# Patient Record
Sex: Male | Born: 1995 | Race: White | Hispanic: No | Marital: Married | Smoking: Current every day smoker
Health system: Southern US, Community
[De-identification: ages and names within clinical notes are randomized; demographics above are authoritative.]

## PROBLEM LIST (undated history)

## (undated) DIAGNOSIS — F4310 Post-traumatic stress disorder, unspecified: Secondary | ICD-10-CM

## (undated) DIAGNOSIS — F319 Bipolar disorder, unspecified: Secondary | ICD-10-CM

## (undated) HISTORY — PX: NO PAST SURGERIES: SHX2092

## (undated) HISTORY — PX: OTHER SURGICAL HISTORY: SHX169

## (undated) HISTORY — PX: UPPER GASTROINTESTINAL ENDOSCOPY: SHX188

## (undated) HISTORY — DX: Post-traumatic stress disorder, unspecified: F43.10

## (undated) HISTORY — PX: COLONOSCOPY: SHX174

## (undated) NOTE — ED Triage Notes (Signed)
Formatting of this note might be different from the original.  Pt brought in by [facility] from home with c/o mid left sided abdominal pain and n/v that started this morning at 0700. Pt reports he has chronic abdominal pain x 2-2.5 years now, with no diagnosis because he hasn't been able to go to the doctor. EMS reported pt was extremely diaphoretic, pale, curled up in a ball sitting in a chair in the bathroom when they arrived. Pt was given a total of of Fentanyl and 8mg  of Zofran by EMS -     1605-Fentanyl IV by EMS  1610-Fentanyl IV by EMS  1615-Zofran 4mg  IV by EMS  1618-Fentanyl IV by EMS  1630-Zofran 4mg  IV by EMS  1655-Fentanyl IV by EMS    Pt reports the pain is now down to a 3 of 4. Describes the pain as a tightness. Denies nausea currently. Pt was placed on O2 via NC by EMS during medication administration due to O2 sat dropping when he fell asleep. Pt now alert and oriented with O2 sat 95% on RA.   Electronically signed by Kem Boroughs, RN at 01/10/2022  5:43 PM EST

## (undated) NOTE — ED Provider Notes (Signed)
Formatting of this note is different from the original.  Images from the original note were not included.    [facility] EMERGENCY DEPARTMENT  Provider Note    CSN: [id number]  Arrival date & time: 01/10/22  1727        History    Chief Complaint   Patient presents with    Abdominal Pain     Francisco Carlson is a 21 y.o. male.  With a history of chronic abdominal pain, cyclical vomiting, persistent marijuana use, depression, bipolar, PTSD, irritable bowel syndrome, GERD who presents to the ED for evaluation of left-sided abdominal pain, nausea and vomiting.  States the symptoms have been persistent for the past 2 to 2-1/2 years.  Has seen multiple providers and had numerous work-ups done without identifying a cause for his pain.  He states he typically smokes marijuana which makes the pain go away, however it always returns.  Did smoke marijuana yesterday.  States he has abdominal pain at baseline, however it got significantly worse this morning.  Denies fevers, chills, chest pain, shortness of breath, dysuria, frequency, urgency, melena, hematochezia, hematemesis, history of abdominal surgery.  Rated his pain at a 3 out of 10 on initial evaluation.    Abdominal Pain  Associated symptoms: nausea and vomiting          Home Medications  Prior to Admission medications    Medication Sig Start Date End Date Taking? Authorizing Provider   dicyclomine (BENTYL) 20 MG tablet Take 1 tablet (20 mg total) by mouth in the morning, at noon, in the evening, and at bedtime. 07/29/21   Gilmore Laroche, FNP   loperamide (IMODIUM A-D) 2 MG tablet Start: 4 mg PO x1, then 2 mg PO after each loose stool until maint.dose established; Max:16 mg/day 07/29/21   Gilmore Laroche, FNP   omeprazole (PRILOSEC) 40 MG capsule Take 40 mg by mouth daily.    [provider]   promethazine (PHENERGAN) 25 MG tablet Take 25 mg by mouth every 6 (six) hours as needed for nausea or vomiting.    [provider]       Allergies     Patient has no  known allergies.      Review of Systems    Review of Systems   Gastrointestinal:  Positive for abdominal pain, nausea and vomiting.   All other systems reviewed and are negative.    Physical Exam  Updated Vital Signs  BP 123/64   Pulse 89   Temp 97.8 F (36.6 C) (Oral)   Resp 17   Ht 5\' 4"  (1.626 m)   Wt 72.6 kg   SpO2 98%   BMI 27.46 kg/m   Physical Exam  Vitals and nursing note reviewed.   Constitutional:       General: He is not in acute distress.     Appearance: He is well-developed. He is not ill-appearing, toxic-appearing or diaphoretic.   HENT:      Head: Normocephalic and atraumatic.   Eyes:      Conjunctiva/sclera: Conjunctivae normal.   Cardiovascular:      Rate and Rhythm: Normal rate and regular rhythm.      Heart sounds: No murmur heard.  Pulmonary:      Effort: Pulmonary effort is normal. No respiratory distress.      Breath sounds: Normal breath sounds. No stridor. No wheezing, rhonchi or rales.   Abdominal:      General: Abdomen is flat. There is no distension.  Palpations: Abdomen is soft.      Tenderness: There is abdominal tenderness in the suprapubic area, left upper quadrant and left lower quadrant. There is no guarding or rebound. Negative signs include Murphy's sign, Rovsing's sign, McBurney's sign, psoas sign and obturator sign.   Musculoskeletal:         General: No swelling.      Cervical back: Neck supple.   Skin:     General: Skin is warm and dry.      Capillary Refill: Capillary refill takes less than 2 seconds.   Neurological:      General: No focal deficit present.      Mental Status: He is alert and oriented to person, place, and time.   Psychiatric:         Mood and Affect: Mood normal.         Behavior: Behavior normal.     ED Results / Procedures / Treatments    Labs  (all labs ordered are listed, but only abnormal results are displayed)  Labs Reviewed   COMPREHENSIVE METABOLIC PANEL - Abnormal; Notable for the following components:       Result Value    Glucose, Bld  105 (*)     Total Bilirubin 1.8 (*)     Anion gap 4 (*)     All other components within normal limits   CBC - Abnormal; Notable for the following components:    WBC 16.5 (*)     All other components within normal limits   URINALYSIS, ROUTINE W REFLEX MICROSCOPIC - Abnormal; Notable for the following components:    Color, Urine AMBER (*)     APPearance HAZY (*)     Ketones, ur 5 (*)     Protein, ur 100 (*)     All other components within normal limits   LIPASE, BLOOD     EKG  None    Radiology  No results found.    Procedures  Procedures     Medications Ordered in ED  Medications   haloperidol lactate (HALDOL) injection 1 mg (1 mg Intravenous Given 01/10/22 1954)     ED Course/ Medical Decision Making/ A&P      Medical Decision Making  Amount and/or Complexity of Data Reviewed  Labs: ordered.    Risk  Prescription drug management.    This patient presents to the ED for concern of abdominal pain, this involves an extensive number of treatment options, and is a complaint that carries with it a high risk of complications and morbidity. The differential diagnosis for generalized abdominal pain includes, but is not limited to AAA, gastroenteritis, appendicitis, Bowel obstruction, Bowel perforation. Gastroparesis, DKA, Hernia, Inflammatory bowel disease, mesenteric ischemia, pancreatitis, peritonitis SBP, volvulus.     Co morbidities that complicate the patient evaluation    chronic abdominal pain, cyclical vomiting, persistent marijuana use, depression, bipolar, PTSD, irritable bowel syndrome, GERD    My initial workup includes abdominal pain labs, Haldol    Additional history obtained from:  Nursing notes from this visit.  Previous records within EMR system office visit on 08/31/2021 with gastroenterology describing to patient that he likely has cannabis hyperemesis along with IBS and that he should stop smoking marijuana.  These were final recommendations after significant work-up was performed.  EMS provides a portion  of the history.  EMS gave a total of 300 mcg of fentanyl and 8 mg of Zofran prior to arrival    I ordered, reviewed and interpreted labs which  include: CMP, CBC, lipase, urinalysis.  CBC significant for leukocytosis of 16.5.  This is likely reactive to the pain.    Cardiac Monitoring:    The patient was maintained on a cardiac monitor.  I personally viewed and interpreted the cardiac monitored which showed an underlying rhythm of: NSR    Afebrile, hemodynamically stable.  22 year old male presenting to the ED for evaluation of abdominal pain.  Pain is similar in characteristic to the pain that he has had for the past 2 to 2-1/2 years.  Pain typically presents shortly after he smokes marijuana.  Patient did smoke marijuana yesterday.  Has had an extensive GI work-up which has so far been unremarkable.  In the absence of fever or change in his symptoms, I do not see need for imaging at this time.  Patient was given haloperidol for possible cyclical vomiting versus cannabis hyperemesis.  After administration of this, patient stated that his symptoms have nearly resolved, however patient became visibly upset that we are not performing CT scan today and does not believe his pain to be secondary to his marijuana use and wanted to be discharged prior to his labs being resulted.  I did initially agree to this, however did not have the chance to discuss return precautions with him.  He did elope prior to being given his discharge paperwork or discharge education.  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    At this time there does not appear to be any evidence of an acute emergency medical condition and the patient appears stable for discharge with appropriate outpatient follow up.All questions answered.    Patient's case discussed with Dr. Renaye Rakers who agrees with plan to discharge with follow-up.     Note: Portions of  this report may have been transcribed using voice recognition software. Every effort was made to ensure accuracy; however, inadvertent computerized transcription errors may still be present.     Final Clinical Impression(s) / ED Diagnoses  Final diagnoses:   None     Rx / DC Orders  ED Discharge Orders       None           Mora Bellman  01/10/22 2106      Terald Sleeper, MD  01/10/22 2317    Electronically signed by Terald Sleeper, MD at 01/10/2022 11:17 PM EST    Associated attestation - Terald Sleeper, MD - 01/10/2022 11:17 PM EST  Formatting of this note might be different from the original.  Attestation: Medical screening examination/treatment/procedure(s) were performed by non-physician practitioner and as supervising physician I was immediately available for consultation/collaboration.

## (undated) NOTE — ED Notes (Signed)
Formatting of this note might be different from the original.  Pt calling out stating he was ready to leave. Pt agitated because he feels like we "never do anything for him". States we "just dope him up and send him home telling him it's because of the pot he smokes". Derrek Gu, PA at bedside and stated he would get the pt ready for discharge.   Electronically signed by Wonda Horner, RN at 01/10/2022  8:19 PM EST

## (undated) NOTE — ED Notes (Signed)
Formatting of this note might be different from the original.  Pt left without waiting for his discharge paperwork.  Electronically signed by Wonda Horner, RN at 01/10/2022  8:22 PM EST

---
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
## 2015-09-07 ENCOUNTER — Encounter (HOSPITAL_COMMUNITY): Payer: Self-pay | Admitting: Emergency Medicine

## 2015-09-07 ENCOUNTER — Emergency Department (HOSPITAL_COMMUNITY)
Admission: EM | Admit: 2015-09-07 | Discharge: 2015-09-07 | Disposition: A | Discharge status: Home / Self Care | Payer: No Typology Code available for payment source | Attending: Emergency Medicine | Admitting: Emergency Medicine

## 2015-09-07 DIAGNOSIS — F17210 Nicotine dependence, cigarettes, uncomplicated: Secondary | ICD-10-CM | POA: Insufficient documentation

## 2015-09-07 DIAGNOSIS — H65192 Other acute nonsuppurative otitis media, left ear: Secondary | ICD-10-CM | POA: Insufficient documentation

## 2015-09-07 DIAGNOSIS — F319 Bipolar disorder, unspecified: Secondary | ICD-10-CM | POA: Insufficient documentation

## 2015-09-07 MED ORDER — AMOXICILLIN-POT CLAVULANATE 875-125 MG PO TABS: 1.0000 | ORAL_TABLET | Freq: Two times a day (BID) | ORAL | Status: DC

## 2015-09-07 MED ORDER — HYDROCODONE-ACETAMINOPHEN 5-325 MG PO TABS: ORAL_TABLET | ORAL | Status: DC

## 2015-09-07 MED ORDER — IBUPROFEN 800 MG PO TABS: 800.0000 mg | ORAL_TABLET | Freq: Three times a day (TID) | ORAL | Status: DC

## 2015-09-07 NOTE — ED Notes (Signed)
Patient states "about 4-6 days ago I couldn't hear out of my left ear and now it's both ears. They only hurt at night."

## 2015-09-07 NOTE — ED Provider Notes (Signed)
CSN: [id number]     Arrival date & time 09/07/15  1543 History   First MD Initiated Contact with Patient 09/07/15 1655     Chief Complaint  Patient presents with  . Otalgia     (Consider location/radiation/quality/duration/timing/severity/associated sxs/prior Treatment) HPI   Jacob Bradley is a 20 y.o. male who presents to the Emergency Department complaining of left ear pain for 5 days.  He describes a throbbing pain associated with decreased hearing.  Pain is worse at night.  He has put peroxide in his ear and used Q-tips without relief.  He denies fever, cough, ear drainage , headache and dizziness.    Past Medical History  Diagnosis Date  . Bipolar disorder (HCC)    History reviewed. No pertinent past surgical history. History reviewed. No pertinent family history. Social History  Substance Use Topics  . Smoking status: Current Every Day Smoker    Types: Cigarettes  . Smokeless tobacco: None  . Alcohol Use: No    Review of Systems  Constitutional: Negative for fever, chills, activity change and appetite change.  HENT: Positive for ear pain and hearing loss. Negative for congestion, ear discharge, facial swelling, rhinorrhea, sinus pressure, sore throat and trouble swallowing.   Eyes: Negative for visual disturbance.  Respiratory: Negative for cough, shortness of breath, wheezing and stridor.   Gastrointestinal: Negative for nausea, vomiting and abdominal pain.  Musculoskeletal: Negative for neck pain and neck stiffness.  Skin: Negative.   Neurological: Negative for dizziness, weakness, numbness and headaches.  Hematological: Negative for adenopathy.  Psychiatric/Behavioral: Negative for confusion.  All other systems reviewed and are negative.     Allergies  Review of patient's allergies indicates no known allergies.  Home Medications   Prior to Admission medications   Medication Sig Start Date End Date Taking? Authorizing Provider  amoxicillin-clavulanate  (AUGMENTIN) 875-125 MG tablet Take 1 tablet by mouth 2 (two) times daily. For 10 days 09/07/15   Quinette Hentges, PA-C  divalproex (DEPAKOTE ER) 500 MG 24 hr tablet Take 1 tablet (500 mg total) by mouth daily. 09/23/11 09/22/12  Gayland CurryGayathri D Tadepalli, MD  HYDROcodone-acetaminophen (NORCO/VICODIN) 5-325 MG tablet Take one tab po q 4-6 hrs prn pain 09/07/15   Joseph Bias, PA-C  ibuprofen (ADVIL,MOTRIN) 800 MG tablet Take 1 tablet (800 mg total) by mouth 3 (three) times daily. 09/07/15   Jeancarlo Leffler, PA-C   BP 140/85 mmHg  Pulse 73  Temp(Src) 98.7 F (37.1 C) (Oral)  Resp 16  Ht 5\' 5"  (1.651 m)  Wt 95.255 kg  BMI 34.95 kg/m2  SpO2 96% Physical Exam  Constitutional: He is oriented to person, place, and time. He appears well-developed and well-nourished. No distress.  HENT:  Head: Normocephalic and atraumatic.  Right Ear: Tympanic membrane and ear canal normal.  Left Ear: Ear canal normal. No drainage or swelling. No mastoid tenderness. No hemotympanum.  Mouth/Throat: Uvula is midline, oropharynx is clear and moist and mucous membranes are normal. No uvula swelling. No oropharyngeal exudate.  Erythema of the left TM.  Mild middle ear effusion present.  No drainage or edema of the ear canal.   Neck: Normal range of motion. Neck supple.  Cardiovascular: Normal rate, regular rhythm, normal heart sounds and intact distal pulses.   No murmur heard. Pulmonary/Chest: Effort normal and breath sounds normal. No stridor. No respiratory distress.  Musculoskeletal: Normal range of motion.  Lymphadenopathy:    He has no cervical adenopathy.  Neurological: He is alert and oriented to person, place, and  time. Coordination normal.  Skin: Skin is warm and dry. No rash noted.  Nursing note and vitals reviewed.   ED Course  Procedures (including critical care time) Labs Review Labs Reviewed - No data to display  Imaging Review No results found. I have personally reviewed and evaluated these images and  lab results as part of my medical decision-making.   EKG Interpretation None      MDM   Final diagnoses:  Acute nonsuppurative otitis media of left ear    Pt with significant OM.  Otherwise well appearing.  Advised of possible TM rupture and need for ENT f/u.  Pt stable for d/c.  Rx for augmentin, ibuprofen and #10 vicodin    Pauline Aus, PA-C 09/07/15 1722  Loren Racer, MD 09/08/15 1606

## 2015-09-07 NOTE — Discharge Instructions (Signed)
Otitis Media, Adult °Otitis media is redness, soreness, and puffiness (swelling) in the space just behind your eardrum (middle ear). It may be caused by allergies or infection. It often happens along with a cold. °HOME CARE °· Take your medicine as told. Finish it even if you start to feel better. °· Only take over-the-counter or prescription medicines for pain, discomfort, or fever as told by your doctor. °· Follow up with your doctor as told. °GET HELP IF: °· You have otitis media only in one ear, or bleeding from your nose, or both. °· You notice a lump on your neck. °· You are not getting better in 3-5 days. °· You feel worse instead of better. °GET HELP RIGHT AWAY IF:  °· You have pain that is not helped with medicine. °· You have puffiness, redness, or pain around your ear. °· You get a stiff neck. °· You cannot move part of your face (paralysis). °· You notice that the bone behind your ear hurts when you touch it. °MAKE SURE YOU:  °· Understand these instructions. °· Will watch your condition. °· Will get help right away if you are not doing well or get worse. °  °This information is not intended to replace advice given to you by your health care provider. Make sure you discuss any questions you have with your health care provider. °  °Document Released: 07/27/2007 Document Revised: 02/28/2014 Document Reviewed: 09/04/2012 °Elsevier Interactive Patient Education ©2016 [employer] ° ° °

## 2015-11-21 ENCOUNTER — Emergency Department (HOSPITAL_COMMUNITY)
Admission: EM | Admit: 2015-11-21 | Discharge: 2015-11-21 | Disposition: A | Discharge status: Home / Self Care | Payer: Medicaid Other | Attending: Emergency Medicine | Admitting: Emergency Medicine

## 2015-11-21 ENCOUNTER — Encounter (HOSPITAL_COMMUNITY): Payer: Self-pay | Admitting: Emergency Medicine

## 2015-11-21 DIAGNOSIS — S60450A Superficial foreign body of right index finger, initial encounter: Secondary | ICD-10-CM | POA: Insufficient documentation

## 2015-11-21 DIAGNOSIS — Z23 Encounter for immunization: Secondary | ICD-10-CM | POA: Insufficient documentation

## 2015-11-21 DIAGNOSIS — Y929 Unspecified place or not applicable: Secondary | ICD-10-CM | POA: Insufficient documentation

## 2015-11-21 DIAGNOSIS — Y999 Unspecified external cause status: Secondary | ICD-10-CM | POA: Insufficient documentation

## 2015-11-21 DIAGNOSIS — W458XXA Other foreign body or object entering through skin, initial encounter: Secondary | ICD-10-CM | POA: Insufficient documentation

## 2015-11-21 DIAGNOSIS — Y939 Activity, unspecified: Secondary | ICD-10-CM | POA: Insufficient documentation

## 2015-11-21 DIAGNOSIS — F17210 Nicotine dependence, cigarettes, uncomplicated: Secondary | ICD-10-CM | POA: Insufficient documentation

## 2015-11-21 DIAGNOSIS — S6990XA Unspecified injury of unspecified wrist, hand and finger(s), initial encounter: Secondary | ICD-10-CM

## 2015-11-21 MED ORDER — POVIDONE-IODINE 10 % EX SOLN
CUTANEOUS | Status: AC
  Filled 2015-11-21: qty 118

## 2015-11-21 MED ORDER — LIDOCAINE HCL (PF) 1 % IJ SOLN
INTRAMUSCULAR | Status: AC
  Filled 2015-11-21: qty 5

## 2015-11-21 MED ORDER — IBUPROFEN 800 MG PO TABS: 800.0000 mg | ORAL_TABLET | Freq: Three times a day (TID) | ORAL | 0 refills | Status: DC

## 2015-11-21 MED ORDER — BACITRACIN ZINC 500 UNIT/GM EX OINT
TOPICAL_OINTMENT | CUTANEOUS | Status: AC
  Filled 2015-11-21: qty 0.9

## 2015-11-21 MED ORDER — TETANUS-DIPHTH-ACELL PERTUSSIS 5-2.5-18.5 LF-MCG/0.5 IM SUSP
0.5000 mL | Freq: Once | INTRAMUSCULAR | Status: AC
  Administered 2015-11-21: 0.5 mL via INTRAMUSCULAR
  Filled 2015-11-21: qty 0.5

## 2015-11-21 MED ORDER — BACITRACIN ZINC 500 UNIT/GM EX OINT: 1.0000 "application " | TOPICAL_OINTMENT | Freq: Two times a day (BID) | CUTANEOUS | 0 refills | Status: DC

## 2015-11-21 MED ORDER — AMOXICILLIN-POT CLAVULANATE 875-125 MG PO TABS: 1.0000 | ORAL_TABLET | Freq: Two times a day (BID) | ORAL | 0 refills | Status: DC

## 2015-11-21 MED ORDER — LIDOCAINE HCL (PF) 1 % IJ SOLN
30.0000 mL | Freq: Once | INTRAMUSCULAR | Status: DC
  Filled 2015-11-21: qty 30

## 2015-11-21 NOTE — ED Notes (Signed)
Patient right index finger cleaned and dressed.

## 2015-11-21 NOTE — ED Triage Notes (Signed)
Patient has small fishing hook with single barb in "pad" of right index finger. No active bleeding noted. Patient unsure of last Tetanus vaccination.

## 2015-11-21 NOTE — ED Provider Notes (Signed)
[facility]-EMERGENCY DEPT Provider Note   CSN: [id number] Arrival date & time: 11/21/15  1026   By signing my name below, I, Valentino SaxonBianca Contreras and Doreatha MartinEva Mathews, attest that this documentation has been prepared under the direction and in the presence of Eber HongBrian Shelita Steptoe, MD. Electronically Signed: Valentino SaxonBianca Contreras and Doreatha MartinEva Mathews, ED Scribe. 11/21/15. 11:12 AM.  History   Chief Complaint Chief Complaint  Patient presents with  . Foreign Body in Skin     The history is provided by the patient. No language interpreter was used.   HPI Comments: Jacob BodoRobert A Bradley is a 20 y.o. male who presents to the Emergency Department complaining of acute onset, persistent right index finger pain s/p injury that occurred this morning. Pt states he accidentally got a fishing hook caught in his finger, causing his injury. Per pt, bleeding is controlled. He denies additional injuries. Pt states his pain is worsened with palpation. Pt states tetanus shot is out of date, with last shot more than ten years ago. He denies fever, cough, numbness, vomiting, and any other symptoms. Pt states he is a former smoker.   Past Medical History:  Diagnosis Date  . Bipolar disorder [facility](HCC)     Patient Active Problem List   Diagnosis Date Noted  . Recurrent major depression-severe (HCC) 09/20/2011    Class: Diagnosis of  . Oppositional defiant disorder 09/20/2011    Class: Diagnosis of  . Suicidal ideations 09/18/2011  . Unspecified episodic mood disorder 09/18/2011  . Polysubstance abuse 09/18/2011    History reviewed. No pertinent surgical history.     Home Medications    Prior to Admission medications   Medication Sig Start Date End Date Taking? Authorizing Provider  amoxicillin-clavulanate (AUGMENTIN) 875-125 MG tablet Take 1 tablet by mouth every 12 (twelve) hours. 11/21/15   Eber HongBrian Quetzali Heinle, MD  bacitracin ointment Apply 1 application topically 2 (two) times daily. 11/21/15   Eber HongBrian Kimmie Berggren, MD  ibuprofen (ADVIL,MOTRIN)  800 MG tablet Take 1 tablet (800 mg total) by mouth 3 (three) times daily. 11/21/15   Eber HongBrian Mavrik Bynum, MD    Family History No family history on file.  Social History Social History  Substance Use Topics  . Smoking status: Current Every Day Smoker    Packs/day: 1.00    Years: 8.00    Types: Cigarettes  . Smokeless tobacco: Never Used  . Alcohol use No     Allergies   Review of patient's allergies indicates no known allergies.   Review of Systems Review of Systems  Constitutional: Negative for fever.  Respiratory: Negative for cough.   Gastrointestinal: Negative for vomiting.  Skin: Positive for wound.  Neurological: Negative for numbness.     Physical Exam Updated Vital Signs BP 145/84 (BP Location: Left Arm)   Pulse 93   Temp 97.9 F (36.6 C) (Oral)   Resp 16   Ht 5\' 2"  (1.575 m)   Wt 195 lb (88.5 kg)   SpO2 98%   BMI 35.67 kg/m   Physical Exam  Constitutional: He appears well-developed and well-nourished.  HENT:  Head: Normocephalic and atraumatic.  Eyes: Conjunctivae are normal. Right eye exhibits no discharge. Left eye exhibits no discharge.  Pulmonary/Chest: Effort normal. No respiratory distress.  Musculoskeletal:  Hook in right index finger pad inserted quite deep. No numbness distal to the hook  Neurological: He is alert. Coordination normal.  Skin: Skin is warm and dry. No rash noted. He is not diaphoretic. No erythema.  Psychiatric: He has a normal mood and affect.  Nursing note and vitals reviewed.    ED Treatments / Results  DIAGNOSTIC STUDIES: Oxygen Saturation is 98% on RA, normal by my interpretation.    COORDINATION OF CARE: 10:46 AM. Discussed treatment plan with pt at bedside which includes foreign body removal and pt agreed to plan.  Labs (all labs ordered are listed, but only abnormal results are displayed) Labs Reviewed - No data to display  EKG  EKG Interpretation None       Radiology No results  found.  Procedures .Foreign Body Removal Date/Time: 11/21/2015 11:23 AM Performed by: Eber Hong Authorized by: Eber Hong  Consent: Verbal consent obtained. Risks and benefits: risks, benefits and alternatives were discussed Consent given by: patient Patient understanding: patient states understanding of the procedure being performed Patient identity confirmed: verbally with patient Time out: Immediately prior to procedure a "time out" was called to verify the correct patient, procedure, equipment, support staff and site/side marked as required. Body area: skin Anesthesia: digital block  Anesthesia: Local Anesthetic: lidocaine 1% without epinephrine Anesthetic total: 7 mL  Sedation: Patient sedated: no Patient restrained: no Patient cooperative: yes Localization method: visualized Removal mechanism: forceps Dressing: antibiotic ointment and dressing applied Tendon involvement: none Depth: deep Complexity: simple 1 objects recovered. Objects recovered: fish hook Post-procedure assessment: foreign body removed Patient tolerance: Patient tolerated the procedure well with no immediate complications Comments: Soaked finger in saline / betadine - cleaned and topical dressing / TDAP and augmentin   (including critical care time)  Medications Ordered in ED Medications  lidocaine (PF) (XYLOCAINE) 1 % injection 30 mL (not administered)  lidocaine (PF) (XYLOCAINE) 1 % injection (not administered)  lidocaine (PF) (XYLOCAINE) 1 % injection (not administered)  bacitracin 500 UNIT/GM ointment (not administered)  povidone-iodine (BETADINE) 10 % external solution (not administered)  Tdap (BOOSTRIX) injection 0.5 mL (0.5 mLs Intramuscular Given 11/21/15 1117)     Initial Impression / Assessment and Plan / ED Course  I have reviewed the triage vital signs and the nursing notes.  Pertinent labs & imaging results that were available during my care of the patient were reviewed by  me and considered in my medical decision making (see chart for details).  Clinical Course   I personally performed the services described in this documentation, which was scribed in my presence. The recorded information has been reviewed and is accurate.     Final Clinical Impressions(s) / ED Diagnoses   Final diagnoses:  None    Well appaering, sx and sx of infection given for return, proph abx Pt in agreement.  New Prescriptions New Prescriptions   AMOXICILLIN-CLAVULANATE (AUGMENTIN) 875-125 MG TABLET    Take 1 tablet by mouth every 12 (twelve) hours.   BACITRACIN OINTMENT    Apply 1 application topically 2 (two) times daily.   IBUPROFEN (ADVIL,MOTRIN) 800 MG TABLET    Take 1 tablet (800 mg total) by mouth 3 (three) times daily.         Eber Hong, MD 11/21/15 1125

## 2016-04-16 ENCOUNTER — Emergency Department (HOSPITAL_COMMUNITY)
Admission: EM | Admit: 2016-04-16 | Discharge: 2016-04-16 | Disposition: A | Discharge status: Home / Self Care | Payer: Self-pay | Attending: Emergency Medicine | Admitting: Emergency Medicine

## 2016-04-16 ENCOUNTER — Encounter (HOSPITAL_COMMUNITY): Payer: Self-pay | Admitting: *Deleted

## 2016-04-16 DIAGNOSIS — F17210 Nicotine dependence, cigarettes, uncomplicated: Secondary | ICD-10-CM | POA: Insufficient documentation

## 2016-04-16 DIAGNOSIS — Z79899 Other long term (current) drug therapy: Secondary | ICD-10-CM | POA: Insufficient documentation

## 2016-04-16 DIAGNOSIS — M791 Myalgia: Secondary | ICD-10-CM | POA: Insufficient documentation

## 2016-04-16 DIAGNOSIS — R5383 Other fatigue: Secondary | ICD-10-CM | POA: Insufficient documentation

## 2016-04-16 DIAGNOSIS — R112 Nausea with vomiting, unspecified: Secondary | ICD-10-CM | POA: Insufficient documentation

## 2016-04-16 LAB — COMPREHENSIVE METABOLIC PANEL
ALK PHOS: 48 U/L (ref 38–126)
ALT: 39 U/L (ref 17–63)
AST: 33 U/L (ref 15–41)
Albumin: 4.7 g/dL (ref 3.5–5.0)
Anion gap: 9 (ref 5–15)
BILIRUBIN TOTAL: 1.6 mg/dL — AB (ref 0.3–1.2)
BUN: 14 mg/dL (ref 6–20)
CALCIUM: 9.4 mg/dL (ref 8.9–10.3)
CO2: 23 mmol/L (ref 22–32)
CREATININE: 1.04 mg/dL (ref 0.61–1.24)
Chloride: 101 mmol/L (ref 101–111)
GFR calc non Af Amer: >60 mL/min (ref >60)
Glucose, Bld: 107 mg/dL — ABNORMAL HIGH (ref 65–99)
Potassium: 4.2 mmol/L (ref 3.5–5.1)
SODIUM: 133 mmol/L — AB (ref 135–145)
TOTAL PROTEIN: 7.7 g/dL (ref 6.5–8.1)

## 2016-04-16 LAB — CBC
HCT: 45.4 % (ref 39.0–52.0)
Hemoglobin: 16 g/dL (ref 13.0–17.0)
MCH: 31.6 pg (ref 26.0–34.0)
MCHC: 35.2 g/dL (ref 30.0–36.0)
MCV: 89.5 fL (ref 78.0–100.0)
PLATELETS: 217 10*3/uL (ref 150–400)
RBC: 5.07 MIL/uL (ref 4.22–5.81)
RDW: 12.3 % (ref 11.5–15.5)
WBC: 12.5 10*3/uL — ABNORMAL HIGH (ref 4.0–10.5)

## 2016-04-16 MED ORDER — ONDANSETRON 4 MG PO TBDP
4.0000 mg | ORAL_TABLET | Freq: Once | ORAL | Status: AC
  Administered 2016-04-16: 4 mg via ORAL
  Filled 2016-04-16: qty 1

## 2016-04-16 MED ORDER — ONDANSETRON 4 MG PO TBDP: ORAL_TABLET | ORAL | 0 refills | Status: DC

## 2016-04-16 NOTE — ED Triage Notes (Signed)
Nausea , vomiting and abdominal pain onset today, also has body aches

## 2016-04-16 NOTE — ED Provider Notes (Signed)
[facility]-EM[facility]Y DEPT Provider Note   CSN: [id number] Arrival date & time: 04/16/16  1310     History   Chief Complaint Chief Complaint  Patient presents with  . Nausea  . Abdominal Pain    HPI Jacob Bradley is a 21 y.o. male.   Emesis   This is a new problem. The current episode started 3 to 5 hours ago. Episode frequency: once. The problem has been gradually improving. The emesis has an appearance of stomach contents. There has been no fever. Associated symptoms include chills and myalgias. Pertinent negatives include no cough and no fever. Risk factors include ill contacts.    Past Medical History:  Diagnosis Date  . Bipolar disorder [facility](HCC)     Patient Active Problem List   Diagnosis Date Noted  . Recurrent major depression-severe (HCC) 09/20/2011    Class: Diagnosis of  . Oppositional defiant disorder 09/20/2011    Class: Diagnosis of  . Suicidal ideations 09/18/2011  . Unspecified episodic mood disorder 09/18/2011  . Polysubstance abuse 09/18/2011    History reviewed. No pertinent surgical history.     Home Medications    Prior to Admission medications   Medication Sig Start Date End Date Taking? Authorizing Provider  amoxicillin-clavulanate (AUGMENTIN) 875-125 MG tablet Take 1 tablet by mouth every 12 (twelve) hours. 11/21/15   Eber HongBrian Miller, MD  bacitracin ointment Apply 1 application topically 2 (two) times daily. 11/21/15   Eber HongBrian Miller, MD  ibuprofen (ADVIL,MOTRIN) 800 MG tablet Take 1 tablet (800 mg total) by mouth 3 (three) times daily. 11/21/15   Eber HongBrian Miller, MD  ondansetron (ZOFRAN ODT) 4 MG disintegrating tablet 4mg  ODT q6 hours prn nausea/vomit 04/16/16   Marily MemosJason Kaulder Zahner, MD    Family History No family history on file.  Social History Social History  Substance Use Topics  . Smoking status: Current Every Day Smoker    Packs/day: 1.00    Years: 8.00    Types: Cigarettes  . Smokeless tobacco: Never Used  . Alcohol use Not on file      Allergies   Patient has no known allergies.   Review of Systems Review of Systems  Constitutional: Positive for chills and fatigue. Negative for fever.  HENT: Negative for congestion, ear discharge and ear pain.   Respiratory: Negative for cough and shortness of breath.   Cardiovascular: Negative for chest pain.  Gastrointestinal: Positive for vomiting.  Musculoskeletal: Positive for myalgias.  Skin: Negative for color change.  All other systems reviewed and are negative.    Physical Exam Updated Vital Signs BP 125/69 (BP Location: Right Arm)   Pulse 100   Temp 97.6 F (36.4 C) (Oral)   Resp 20   Ht 5\' 4"  (1.626 m)   Wt 219 lb (99.3 kg)   SpO2 98%   BMI 37.59 kg/m   Physical Exam  Constitutional: He is oriented to person, place, and time. He appears well-developed and well-nourished.  HENT:  Head: Normocephalic and atraumatic.  Eyes: Conjunctivae and EOM are normal.  Neck: Normal range of motion.  Cardiovascular: Normal rate.   Pulmonary/Chest: Effort normal. No respiratory distress.  Abdominal: He exhibits no distension and no mass. There is no tenderness. There is no guarding.  Musculoskeletal: Normal range of motion.  Neurological: He is alert and oriented to person, place, and time. No cranial nerve deficit. Coordination normal.  Nursing note and vitals reviewed.    ED Treatments / Results  Labs (all labs ordered are listed, but only abnormal results are  displayed) Labs Reviewed  COMPREHENSIVE METABOLIC PANEL - Abnormal; Notable for the following:       Result Value   Sodium 133 (*)    Glucose, Bld 107 (*)    Total Bilirubin 1.6 (*)    All other components within normal limits  CBC - Abnormal; Notable for the following:    WBC 12.5 (*)    All other components within normal limits    EKG  EKG Interpretation None       Radiology No results found.  Procedures Procedures (including critical care time)  Medications Ordered in  ED Medications  ondansetron (ZOFRAN-ODT) disintegrating tablet 4 mg (4 mg Oral Given 04/16/16 1620)     Initial Impression / Assessment and Plan / ED Course  I have reviewed the triage vital signs and the nursing notes.  Pertinent labs & imaging results that were available during my care of the patient were reviewed by me and considered in my medical decision making (see chart for details).     Likely gastroenteritis. Doubt any intra-abdominal pathology at this time. Plan for supportive care wtih Zofran at home, patient's still tolerating fluids. No indication for imaging at this time.  Final Clinical Impressions(s) / ED Diagnoses   Final diagnoses:  Nausea and vomiting, intractability of vomiting not specified, unspecified vomiting type    New Prescriptions New Prescriptions   ONDANSETRON (ZOFRAN ODT) 4 MG DISINTEGRATING TABLET    4mg  ODT q6 hours prn nausea/vomit     Marily Memos, MD 04/16/16 [phone]

## 2018-05-18 ENCOUNTER — Emergency Department (HOSPITAL_COMMUNITY): Payer: 59

## 2018-05-18 ENCOUNTER — Emergency Department (HOSPITAL_COMMUNITY)
Admission: EM | Admit: 2018-05-18 | Discharge: 2018-05-18 | Disposition: A | Discharge status: Home / Self Care | Payer: 59 | Attending: Emergency Medicine | Admitting: Emergency Medicine

## 2018-05-18 ENCOUNTER — Other Ambulatory Visit: Payer: Self-pay

## 2018-05-18 ENCOUNTER — Encounter (HOSPITAL_COMMUNITY): Payer: Self-pay

## 2018-05-18 DIAGNOSIS — R1013 Epigastric pain: Secondary | ICD-10-CM | POA: Diagnosis not present

## 2018-05-18 DIAGNOSIS — F17210 Nicotine dependence, cigarettes, uncomplicated: Secondary | ICD-10-CM | POA: Insufficient documentation

## 2018-05-18 DIAGNOSIS — R1010 Upper abdominal pain, unspecified: Secondary | ICD-10-CM

## 2018-05-18 LAB — BASIC METABOLIC PANEL
ANION GAP: 14 (ref 5–15)
BUN: 15 mg/dL (ref 6–20)
CO2: 23 mmol/L (ref 22–32)
Calcium: 10.1 mg/dL (ref 8.9–10.3)
Chloride: 101 mmol/L (ref 98–111)
Creatinine, Ser: 1.18 mg/dL (ref 0.61–1.24)
Glucose, Bld: 127 mg/dL — ABNORMAL HIGH (ref 70–99)
POTASSIUM: 3.1 mmol/L — AB (ref 3.5–5.1)
SODIUM: 138 mmol/L (ref 135–145)

## 2018-05-18 LAB — CBC
HCT: 48.4 % (ref 39.0–52.0)
HEMOGLOBIN: 16.9 g/dL (ref 13.0–17.0)
MCH: 31 pg (ref 26.0–34.0)
MCHC: 34.9 g/dL (ref 30.0–36.0)
MCV: 88.8 fL (ref 80.0–100.0)
NRBC: 0 % (ref 0.0–0.2)
PLATELETS: 267 10*3/uL (ref 150–400)
RBC: 5.45 MIL/uL (ref 4.22–5.81)
RDW: 12 % (ref 11.5–15.5)
WBC: 11.2 10*3/uL — AB (ref 4.0–10.5)

## 2018-05-18 LAB — HEPATIC FUNCTION PANEL
ALT: 30 U/L (ref 0–44)
AST: 37 U/L (ref 15–41)
Albumin: 5.2 g/dL — ABNORMAL HIGH (ref 3.5–5.0)
Alkaline Phosphatase: 52 U/L (ref 38–126)
Bilirubin, Direct: 0.1 mg/dL (ref 0.0–0.2)
Indirect Bilirubin: 1.7 mg/dL — ABNORMAL HIGH (ref 0.3–0.9)
TOTAL PROTEIN: 8.4 g/dL — AB (ref 6.5–8.1)
Total Bilirubin: 1.8 mg/dL — ABNORMAL HIGH (ref 0.3–1.2)

## 2018-05-18 LAB — LIPASE, BLOOD: Lipase: 28 U/L (ref 11–51)

## 2018-05-18 LAB — TROPONIN I

## 2018-05-18 MED ORDER — POTASSIUM CHLORIDE CRYS ER 20 MEQ PO TBCR
40.0000 meq | EXTENDED_RELEASE_TABLET | Freq: Once | ORAL | Status: AC
  Administered 2018-05-18: 40 meq via ORAL
  Filled 2018-05-18: qty 2

## 2018-05-18 MED ORDER — PANTOPRAZOLE SODIUM 40 MG PO TBEC: 40.0000 mg | DELAYED_RELEASE_TABLET | Freq: Every day | ORAL | 0 refills | Status: DC

## 2018-05-18 MED ORDER — SODIUM CHLORIDE 0.9% FLUSH
3.0000 mL | Freq: Once | INTRAVENOUS | Status: AC
  Administered 2018-05-18: 3 mL via INTRAVENOUS

## 2018-05-18 MED ORDER — MORPHINE SULFATE (PF) 4 MG/ML IV SOLN
4.0000 mg | Freq: Once | INTRAVENOUS | Status: AC
  Administered 2018-05-18: 4 mg via INTRAVENOUS
  Filled 2018-05-18: qty 1

## 2018-05-18 MED ORDER — ONDANSETRON HCL 4 MG/2ML IJ SOLN
4.0000 mg | Freq: Once | INTRAMUSCULAR | Status: AC | PRN
  Administered 2018-05-18: 4 mg via INTRAVENOUS
  Filled 2018-05-18: qty 2

## 2018-05-18 MED ORDER — FAMOTIDINE 20 MG PO TABS: 20.0000 mg | ORAL_TABLET | Freq: Two times a day (BID) | ORAL | 0 refills | Status: DC

## 2018-05-18 MED ORDER — SODIUM CHLORIDE 0.9 % IV BOLUS
1000.0000 mL | Freq: Once | INTRAVENOUS | Status: AC
  Administered 2018-05-18: 1000 mL via INTRAVENOUS

## 2018-05-18 NOTE — ED Provider Notes (Signed)
[facility] EMERGENCY DEPARTMENT Provider Note   CSN: [id number] Arrival date & time: 05/18/18  0631    History   Chief Complaint Chief Complaint  Patient presents with   Chest Pain    HPI Jacob Bradley is a 23 y.o. male.     HPI  23 year old male presents with upper abdominal pain.  Started this morning around 4 AM.  He states he had pizza last night.  He vomited multiple times though has not seen any blood.  The pain is in his lower chest/upper abdomen.  He states he was hyperventilating and thought he might pass out when he was first here but otherwise has not felt short of breath.  No fevers.  No pain in his back. Pain is currently about a 2.  Past Medical History:  Diagnosis Date   Bipolar disorder [facility])     Patient Active Problem List   Diagnosis Date Noted   Recurrent major depression-severe (HCC) 09/20/2011    Class: Diagnosis of   Oppositional defiant disorder 09/20/2011    Class: Diagnosis of   Suicidal ideations 09/18/2011   Unspecified episodic mood disorder 09/18/2011   Polysubstance abuse (HCC) 09/18/2011    History reviewed. No pertinent surgical history.      Home Medications    Prior to Admission medications   Medication Sig Start Date End Date Taking? Authorizing Provider  amoxicillin-clavulanate (AUGMENTIN) 875-125 MG tablet Take 1 tablet by mouth every 12 (twelve) hours. 11/21/15   Eber Hong, MD  bacitracin ointment Apply 1 application topically 2 (two) times daily. 11/21/15   Eber Hong, MD  famotidine (PEPCID) 20 MG tablet Take 1 tablet (20 mg total) by mouth 2 (two) times daily. 05/18/18   Pricilla Loveless, MD  ibuprofen (ADVIL,MOTRIN) 800 MG tablet Take 1 tablet (800 mg total) by mouth 3 (three) times daily. 11/21/15   Eber Hong, MD  ondansetron (ZOFRAN ODT) 4 MG disintegrating tablet 4mg  ODT q6 hours prn nausea/vomit 04/16/16   Mesner, Barbara Cower, MD  pantoprazole (PROTONIX) 40 MG tablet Take 1 tablet (40 mg total) by mouth daily.  05/18/18   Pricilla Loveless, MD    Family History No family history on file.  Social History Social History   Tobacco Use   Smoking status: Current Every Day Smoker    Packs/day: 1.00    Years: 8.00    Pack years: 8.00    Types: Cigarettes   Smokeless tobacco: Never Used  Substance Use Topics   Alcohol use: Never    Frequency: Never   Drug use: Yes    Types: Marijuana    Comment: in the past      Allergies   Patient has no known allergies.   Review of Systems Review of Systems  Constitutional: Negative for fever.  Respiratory: Negative for shortness of breath.   Cardiovascular: Positive for chest pain.  Gastrointestinal: Positive for abdominal pain, nausea and vomiting.  Musculoskeletal: Negative for back pain.  All other systems reviewed and are negative.    Physical Exam Updated Vital Signs BP (!) 141/100    Pulse 67    Temp 98.1 F (36.7 C) (Oral)    Resp 14    Ht 5\' 6"  (1.676 m)    Wt 99.3 kg    SpO2 100%    BMI 35.33 kg/m   Physical Exam Vitals signs and nursing note reviewed.  Constitutional:      General: He is not in acute distress.    Appearance: He is well-developed. He  is obese. He is not ill-appearing or diaphoretic.  HENT:     Head: Normocephalic and atraumatic.     Right Ear: External ear normal.     Left Ear: External ear normal.     Nose: Nose normal.  Eyes:     General:        Right eye: No discharge.        Left eye: No discharge.  Neck:     Musculoskeletal: Neck supple.  Cardiovascular:     Rate and Rhythm: Normal rate and regular rhythm.     Heart sounds: Normal heart sounds.  Pulmonary:     Effort: Pulmonary effort is normal.     Breath sounds: Normal breath sounds.  Abdominal:     Palpations: Abdomen is soft.     Tenderness: There is abdominal tenderness in the right upper quadrant, epigastric area and left upper quadrant.  Skin:    General: Skin is warm and dry.  Neurological:     Mental Status: He is alert.    Psychiatric:        Mood and Affect: Mood is not anxious.      ED Treatments / Results  Labs (all labs ordered are listed, but only abnormal results are displayed) Labs Reviewed  BASIC METABOLIC PANEL - Abnormal; Notable for the following components:      Result Value   Potassium 3.1 (*)    Glucose, Bld 127 (*)    All other components within normal limits  CBC - Abnormal; Notable for the following components:   WBC 11.2 (*)    All other components within normal limits  HEPATIC FUNCTION PANEL - Abnormal; Notable for the following components:   Total Protein 8.4 (*)    Albumin 5.2 (*)    Total Bilirubin 1.8 (*)    Indirect Bilirubin 1.7 (*)    All other components within normal limits  TROPONIN I  LIPASE, BLOOD    EKG EKG Interpretation  Date/Time:  Friday May 18 2018 06:42:14 EDT Ventricular Rate:  81 PR Interval:    QRS Duration: 105 QT Interval:  397 QTC Calculation: 461 R Axis:   86 Text Interpretation:  Sinus rhythm Normal ECG Confirmed by Geoffery LyonseLo, Douglas ([id number]) on 05/18/2018 6:44:41 AM   Radiology Dg Chest 2 View  Result Date: 05/18/2018 CLINICAL DATA:  Chest pain EXAM: CHEST - 2 VIEW COMPARISON:  None. FINDINGS: Lungs are clear. Heart size and pulmonary vascularity are normal. No adenopathy. No pneumothorax. No bone lesions. IMPRESSION: No edema or consolidation. Electronically Signed   By: Bretta BangWilliam  Woodruff III M.D.   On: 05/18/2018 08:15   [place] Abdomen Limited Ruq  Result Date: 05/18/2018 CLINICAL DATA:  Right upper quadrant pain EXAM: ULTRASOUND ABDOMEN LIMITED RIGHT UPPER QUADRANT COMPARISON:  None. FINDINGS: Gallbladder: No gallstones or wall thickening visualized. No sonographic Murphy sign noted by sonographer. Common bile duct: Diameter: 4 mm Liver: No focal lesion identified. Within normal limits in parenchymal echogenicity. Portal vein is patent on color Doppler imaging with normal direction of blood flow towards the liver. IMPRESSION: Normal right upper  quadrant ultrasound. Electronically Signed   By: Elige KoHetal  Patel   On: 05/18/2018 08:47    Procedures Procedures (including critical care time)  Medications Ordered in ED Medications  sodium chloride flush (NS) 0.9 % injection 3 mL (3 mLs Intravenous Given 05/18/18 0649)  ondansetron (ZOFRAN) injection 4 mg (4 mg Intravenous Given 05/18/18 0706)  morphine 4 MG/ML injection 4 mg (4 mg Intravenous Given 05/18/18  1505)  sodium chloride 0.9 % bolus 1,000 mL ( Intravenous Stopped 05/18/18 0842)  morphine 4 MG/ML injection 4 mg (4 mg Intravenous Given 05/18/18 0845)  potassium chloride SA (K-DUR,KLOR-CON) CR tablet 40 mEq (40 mEq Oral Given 05/18/18 0913)     Initial Impression / Assessment and Plan / ED Course  I have reviewed the triage vital signs and the nursing notes.  Pertinent labs & imaging results that were available during my care of the patient were reviewed by me and considered in my medical decision making (see chart for details).        Patient's "chest pain" appears to be upper abdominal pain.  His ECG is benign.  While he did have a troponin obtained from protocol orders, I have very low suspicion for ACS, PE, dissection.  Given the location of his pain, right upper quadrant ultrasound was obtained but shows no gallbladder pathology.  Vomiting has ceased and pain is better.  Could be gastritis and thus will start on PPI/H2 blocker.  Avoid NSAIDs and he denies alcohol use.  We discussed return precautions but I have low suspicion for intra-abdominal emergency or bleeding ulcer.  Final Clinical Impressions(s) / ED Diagnoses   Final diagnoses:  Upper abdominal pain    ED Discharge Orders         Ordered    pantoprazole (PROTONIX) 40 MG tablet  Daily     05/18/18 0903    famotidine (PEPCID) 20 MG tablet  2 times daily     05/18/18 6979           Pricilla Loveless, MD 05/18/18 [phone]

## 2018-05-18 NOTE — Discharge Instructions (Signed)
If you develop worsening, continued, or recurrent abdominal pain, uncontrolled vomiting, fever, chest or back pain, or any other new/concerning symptoms then return to the ER for evaluation.   You may take the medicines prescribed and Tylenol for pain.  Do not take NSAIDs such as ibuprofen, Aleve, aspirin, Advil, Motrin, naproxen/Naprosyn, etc.  Do not drink alcohol.

## 2018-05-18 NOTE — ED Triage Notes (Addendum)
Pt reports epigastric pain/pressure that started this morning around 4 am. Pt reports having several episodes of heartburn this week, but resolved with Tums, however this time pain did not resolve. Pt reports dizziness, shortness of breath, pt is breathing fast in triage. Pt reports vomiting several times this morning and feels better for "a minute" then comes back. Pt reports eating pizza last night before bed last night.

## 2018-05-19 ENCOUNTER — Other Ambulatory Visit: Payer: Self-pay

## 2018-05-19 ENCOUNTER — Emergency Department (HOSPITAL_COMMUNITY): Payer: 59

## 2018-05-19 ENCOUNTER — Emergency Department (HOSPITAL_COMMUNITY)
Admission: EM | Admit: 2018-05-19 | Discharge: 2018-05-19 | Disposition: A | Discharge status: Home / Self Care | Payer: 59 | Attending: Emergency Medicine | Admitting: Emergency Medicine

## 2018-05-19 ENCOUNTER — Encounter (HOSPITAL_COMMUNITY): Payer: Self-pay | Admitting: Emergency Medicine

## 2018-05-19 DIAGNOSIS — F17210 Nicotine dependence, cigarettes, uncomplicated: Secondary | ICD-10-CM | POA: Diagnosis not present

## 2018-05-19 DIAGNOSIS — Z79899 Other long term (current) drug therapy: Secondary | ICD-10-CM | POA: Diagnosis not present

## 2018-05-19 DIAGNOSIS — K2900 Acute gastritis without bleeding: Secondary | ICD-10-CM | POA: Diagnosis not present

## 2018-05-19 DIAGNOSIS — R079 Chest pain, unspecified: Secondary | ICD-10-CM | POA: Diagnosis not present

## 2018-05-19 DIAGNOSIS — R1110 Vomiting, unspecified: Secondary | ICD-10-CM | POA: Diagnosis present

## 2018-05-19 LAB — CBC WITH DIFFERENTIAL/PLATELET
Abs Immature Granulocytes: 0.05 10*3/uL (ref 0.00–0.07)
Basophils Absolute: 0 10*3/uL (ref 0.0–0.1)
Basophils Relative: 0 %
Eosinophils Absolute: 0.1 10*3/uL (ref 0.0–0.5)
Eosinophils Relative: 1 %
HCT: 47.3 % (ref 39.0–52.0)
Hemoglobin: 16.3 g/dL (ref 13.0–17.0)
IMMATURE GRANULOCYTES: 0 %
Lymphocytes Relative: 19 %
Lymphs Abs: 2.3 10*3/uL (ref 0.7–4.0)
MCH: 31.3 pg (ref 26.0–34.0)
MCHC: 34.5 g/dL (ref 30.0–36.0)
MCV: 90.8 fL (ref 80.0–100.0)
Monocytes Absolute: 0.6 10*3/uL (ref 0.1–1.0)
Monocytes Relative: 5 %
Neutro Abs: 8.9 10*3/uL — ABNORMAL HIGH (ref 1.7–7.7)
Neutrophils Relative %: 75 %
Platelets: 246 10*3/uL (ref 150–400)
RBC: 5.21 MIL/uL (ref 4.22–5.81)
RDW: 11.9 % (ref 11.5–15.5)
WBC: 12 10*3/uL — ABNORMAL HIGH (ref 4.0–10.5)
nRBC: 0 % (ref 0.0–0.2)

## 2018-05-19 LAB — COMPREHENSIVE METABOLIC PANEL
ALT: 27 U/L (ref 0–44)
ANION GAP: 14 (ref 5–15)
AST: 31 U/L (ref 15–41)
Albumin: 5 g/dL (ref 3.5–5.0)
Alkaline Phosphatase: 51 U/L (ref 38–126)
BUN: 13 mg/dL (ref 6–20)
CO2: 22 mmol/L (ref 22–32)
Calcium: 10 mg/dL (ref 8.9–10.3)
Chloride: 104 mmol/L (ref 98–111)
Creatinine, Ser: 1.08 mg/dL (ref 0.61–1.24)
GFR calc Af Amer: >60 mL/min (ref >60)
GFR calc non Af Amer: >60 mL/min (ref >60)
Glucose, Bld: 116 mg/dL — ABNORMAL HIGH (ref 70–99)
Potassium: 3.6 mmol/L (ref 3.5–5.1)
SODIUM: 140 mmol/L (ref 135–145)
Total Bilirubin: 1.5 mg/dL — ABNORMAL HIGH (ref 0.3–1.2)
Total Protein: 8.4 g/dL — ABNORMAL HIGH (ref 6.5–8.1)

## 2018-05-19 LAB — LIPASE, BLOOD: Lipase: 40 U/L (ref 11–51)

## 2018-05-19 MED ORDER — ALUM & MAG HYDROXIDE-SIMETH 200-200-20 MG/5ML PO SUSP
30.0000 mL | Freq: Once | ORAL | Status: AC
  Administered 2018-05-19: 30 mL via ORAL
  Filled 2018-05-19: qty 30

## 2018-05-19 MED ORDER — PROMETHAZINE HCL 25 MG PO TABS: 25.0000 mg | ORAL_TABLET | Freq: Four times a day (QID) | ORAL | 0 refills | Status: DC | PRN

## 2018-05-19 MED ORDER — SODIUM CHLORIDE 0.9 % IV BOLUS
1000.0000 mL | Freq: Once | INTRAVENOUS | Status: AC
  Administered 2018-05-19: 1000 mL via INTRAVENOUS

## 2018-05-19 MED ORDER — FAMOTIDINE 20 MG PO TABS
20.0000 mg | ORAL_TABLET | Freq: Once | ORAL | Status: AC
  Administered 2018-05-19: 20 mg via ORAL
  Filled 2018-05-19: qty 1

## 2018-05-19 MED ORDER — MORPHINE SULFATE (PF) 4 MG/ML IV SOLN
4.0000 mg | Freq: Once | INTRAVENOUS | Status: AC
  Administered 2018-05-19: 4 mg via INTRAVENOUS
  Filled 2018-05-19: qty 1

## 2018-05-19 MED ORDER — PANTOPRAZOLE SODIUM 40 MG PO TBEC
40.0000 mg | DELAYED_RELEASE_TABLET | Freq: Once | ORAL | Status: AC
  Administered 2018-05-19: 40 mg via ORAL
  Filled 2018-05-19: qty 1

## 2018-05-19 MED ORDER — IOHEXOL 300 MG/ML  SOLN
100.0000 mL | Freq: Once | INTRAMUSCULAR | Status: AC | PRN
  Administered 2018-05-19: 100 mL via INTRAVENOUS

## 2018-05-19 MED ORDER — ONDANSETRON 4 MG PO TBDP: 4.0000 mg | ORAL_TABLET | Freq: Three times a day (TID) | ORAL | 0 refills | Status: DC | PRN

## 2018-05-19 MED ORDER — ONDANSETRON HCL 4 MG/2ML IJ SOLN
4.0000 mg | Freq: Once | INTRAMUSCULAR | Status: AC
  Administered 2018-05-19: 4 mg via INTRAVENOUS
  Filled 2018-05-19: qty 2

## 2018-05-19 MED ORDER — LIDOCAINE VISCOUS HCL 2 % MT SOLN
15.0000 mL | Freq: Once | OROMUCOSAL | Status: AC
  Administered 2018-05-19: 15 mL via ORAL
  Filled 2018-05-19: qty 15

## 2018-05-19 NOTE — ED Provider Notes (Signed)
[facility] EMERGENCY DEPARTMENT Provider Note   CSN: [id number] Arrival date & time: 05/19/18  0160    History   Chief Complaint Chief Complaint  Patient presents with  . Emesis    HPI Jacob Bradley is a 23 y.o. male.     HPI  23 year old male presents with abdominal pain and vomiting.  He was here yesterday and discharged home.  He was not able to fill the meds given.  He was feeling fine and drinking water throughout the day.  In the middle the night this morning he woke up with abdominal pain again and has been making himself throw up.  He states throwing up temporarily feels better but then the pain comes back.  The pain is severe in his upper abdomen and is sharp.  Not as much burning is yesterday.  The emesis is clear and he thinks he has thrown up about 30 times.  Past Medical History:  Diagnosis Date  . Bipolar disorder [facility])     Patient Active Problem List   Diagnosis Date Noted  . Recurrent major depression-severe (HCC) 09/20/2011    Class: Diagnosis of  . Oppositional defiant disorder 09/20/2011    Class: Diagnosis of  . Suicidal ideations 09/18/2011  . Unspecified episodic mood disorder 09/18/2011  . Polysubstance abuse (HCC) 09/18/2011    History reviewed. No pertinent surgical history.      Home Medications    Prior to Admission medications   Medication Sig Start Date End Date Taking? Authorizing Provider  amoxicillin-clavulanate (AUGMENTIN) 875-125 MG tablet Take 1 tablet by mouth every 12 (twelve) hours. 11/21/15   Eber Hong, MD  bacitracin ointment Apply 1 application topically 2 (two) times daily. 11/21/15   Eber Hong, MD  famotidine (PEPCID) 20 MG tablet Take 1 tablet (20 mg total) by mouth 2 (two) times daily. 05/18/18   Pricilla Loveless, MD  ibuprofen (ADVIL,MOTRIN) 800 MG tablet Take 1 tablet (800 mg total) by mouth 3 (three) times daily. 11/21/15   Eber Hong, MD  ondansetron (ZOFRAN ODT) 4 MG disintegrating tablet 4mg  ODT q6 hours  prn nausea/vomit 04/16/16   Mesner, Barbara Cower, MD  pantoprazole (PROTONIX) 40 MG tablet Take 1 tablet (40 mg total) by mouth daily. 05/18/18   Pricilla Loveless, MD    Family History History reviewed. No pertinent family history.  Social History Social History   Tobacco Use  . Smoking status: Current Every Day Smoker    Packs/day: 1.00    Years: 8.00    Pack years: 8.00    Types: Cigarettes  . Smokeless tobacco: Never Used  Substance Use Topics  . Alcohol use: Never    Frequency: Never  . Drug use: Yes    Types: Marijuana    Comment: in the past      Allergies   Patient has no known allergies.   Review of Systems Review of Systems  Constitutional: Negative for fever.  Gastrointestinal: Positive for abdominal pain, nausea and vomiting.  All other systems reviewed and are negative.    Physical Exam Updated Vital Signs BP (!) 155/94 (BP Location: Right Arm)   Pulse 76   Temp 97.9 F (36.6 C) (Oral)   Resp (!) 22   Ht 5\' 6"  (1.676 m)   Wt 81.6 kg   SpO2 100%   BMI 29.05 kg/m   Physical Exam Vitals signs and nursing note reviewed.  Constitutional:      General: He is not in acute distress.    Appearance: He  is well-developed. He is not ill-appearing or diaphoretic.  HENT:     Head: Normocephalic and atraumatic.     Right Ear: External ear normal.     Left Ear: External ear normal.     Nose: Nose normal.  Eyes:     General:        Right eye: No discharge.        Left eye: No discharge.  Cardiovascular:     Rate and Rhythm: Normal rate.  Pulmonary:     Effort: Pulmonary effort is normal. No tachypnea or accessory muscle usage.  Abdominal:     Palpations: Abdomen is soft.     Tenderness: There is abdominal tenderness in the right upper quadrant, right lower quadrant, epigastric area and left upper quadrant.     Comments: Tenderness is mostly in epigastrum. However he endorses some mild tenderness in RLQ and diffusely  Skin:    General: Skin is warm and dry.   Neurological:     Mental Status: He is alert.  Psychiatric:        Mood and Affect: Mood is not anxious.      ED Treatments / Results  Labs (all labs ordered are listed, but only abnormal results are displayed) Labs Reviewed  COMPREHENSIVE METABOLIC PANEL - Abnormal; Notable for the following components:      Result Value   Glucose, Bld 116 (*)    Total Protein 8.4 (*)    Total Bilirubin 1.5 (*)    All other components within normal limits  CBC WITH DIFFERENTIAL/PLATELET - Abnormal; Notable for the following components:   WBC 12.0 (*)    Neutro Abs 8.9 (*)    All other components within normal limits  LIPASE, BLOOD    EKG None  Radiology Dg Chest 2 View  Result Date: 05/18/2018 CLINICAL DATA:  Chest pain EXAM: CHEST - 2 VIEW COMPARISON:  None. FINDINGS: Lungs are clear. Heart size and pulmonary vascularity are normal. No adenopathy. No pneumothorax. No bone lesions. IMPRESSION: No edema or consolidation. Electronically Signed   By: Bretta Bang III M.D.   On: 05/18/2018 08:15   Ct Abdomen Pelvis W Contrast  Result Date: 05/19/2018 CLINICAL DATA:  Nausea and vomiting.  Abdominal pain. EXAM: CT ABDOMEN AND PELVIS WITH CONTRAST TECHNIQUE: Multidetector CT imaging of the abdomen and pelvis was performed using the standard protocol following bolus administration of intravenous contrast. CONTRAST:  OMNIPAQUE IOHEXOL 300 MG/ML  SOLN COMPARISON:  05/18/2018 abdominal ultrasound. FINDINGS: Lower chest: Clear lung bases. Normal heart size without pericardial or pleural effusion. Hepatobiliary: Focal steatosis adjacent the falciform ligament. Normal gallbladder, without biliary ductal dilatation. Pancreas: Normal, without mass or ductal dilatation. Spleen: Normal in size, without focal abnormality. Adrenals/Urinary Tract: Normal adrenal glands. Normal kidneys, without hydronephrosis. Normal urinary bladder. Stomach/Bowel: Normal stomach, without wall thickening. Normal colon,  appendix, and terminal ileum. Normal small bowel. Vascular/Lymphatic: Normal caliber of the aorta and branch vessels. No abdominopelvic adenopathy. Reproductive: Normal prostate. Other: No significant free fluid. Musculoskeletal: Minimal convex left lumbar spine curvature. IMPRESSION: Normal abdominopelvic CT.  No explanation for patient's symptoms. Electronically Signed   By: Jeronimo Greaves M.D.   On: 05/19/2018 08:49   US Abdomen Limited Ruq  Result Date: 05/18/2018 CLINICAL DATA:  Right upper quadrant pain EXAM: ULTRASOUND ABDOMEN LIMITED RIGHT UPPER QUADRANT COMPARISON:  None. FINDINGS: Gallbladder: No gallstones or wall thickening visualized. No sonographic Murphy sign noted by sonographer. Common bile duct: Diameter: 4 mm Liver: No focal lesion identified. Within  normal limits in parenchymal echogenicity. Portal vein is patent on color Doppler imaging with normal direction of blood flow towards the liver. IMPRESSION: Normal right upper quadrant ultrasound. Electronically Signed   By: Elige Ko   On: 05/18/2018 08:47    Procedures Procedures (including critical care time)  Medications Ordered in ED Medications  sodium chloride 0.9 % bolus 1,000 mL (has no administration in time range)  morphine 4 MG/ML injection 4 mg (has no administration in time range)  ondansetron (ZOFRAN) injection 4 mg (has no administration in time range)     Initial Impression / Assessment and Plan / ED Course  I have reviewed the triage vital signs and the nursing notes.  Pertinent labs & imaging results that were available during my care of the patient were reviewed by me and considered in my medical decision making (see chart for details).        Given patient's pain seems to be worsening, CT obtained.  This does not show any acute abnormalities, specifically the appendix is normal.  No evidence of a rupture.  I think this is probably still gastritis and have encouraged him to start the PPI and H2 blocker  prescribed to him yesterday.  I will also give him antiemetics.  He will be given GI cocktail and PPI/H2 blocker here.  Otherwise, his labs are essentially the same as yesterday besides improved potassium.  He appears stable for discharge home with return precautions.  Final Clinical Impressions(s) / ED Diagnoses   Final diagnoses:  Acute gastritis without hemorrhage, unspecified gastritis type    ED Discharge Orders    None       Pricilla Loveless, MD 05/19/18 [phone]

## 2018-05-19 NOTE — ED Triage Notes (Signed)
Here yesterday for N/V.  Was given prescriptions but did not have them filled.  Pt reports making himself throw up because it makes him feel better.  Vomited about 30 times in last 24 hours.  C/o pain to epigastric area, rating pain 8/10.  Last BM yesterday.  Pt says he got Morphine and 2 "big pills" on yesterday that relieved pain.

## 2018-05-19 NOTE — Discharge Instructions (Signed)
It is important to start the medicines prescribed to you yesterday.  In addition you have been prescribed antinausea medicines.  Do not take NSAIDs such as ibuprofen, Advil, Aleve, Motrin, Naprosyn, naproxen, aspirin, etc.  Do not drink alcohol.  If you develop worsening, continued, or recurrent abdominal pain, uncontrolled vomiting, fever, chest or back pain, or any other new/concerning symptoms then return to the ER for evaluation.

## 2018-09-23 ENCOUNTER — Emergency Department (HOSPITAL_COMMUNITY): Payer: 59

## 2018-09-23 ENCOUNTER — Emergency Department (HOSPITAL_COMMUNITY): Admission: EM | Admit: 2018-09-23 | Discharge: 2018-09-23 | Discharge status: Against Medical Advice | Payer: 59

## 2018-09-23 ENCOUNTER — Emergency Department (HOSPITAL_COMMUNITY)
Admission: EM | Admit: 2018-09-23 | Discharge: 2018-09-23 | Disposition: A | Discharge status: Home / Self Care | Payer: 59 | Attending: Emergency Medicine | Admitting: Emergency Medicine

## 2018-09-23 ENCOUNTER — Encounter (HOSPITAL_COMMUNITY): Payer: Self-pay | Admitting: Emergency Medicine

## 2018-09-23 ENCOUNTER — Other Ambulatory Visit: Payer: Self-pay

## 2018-09-23 DIAGNOSIS — Z79899 Other long term (current) drug therapy: Secondary | ICD-10-CM | POA: Insufficient documentation

## 2018-09-23 DIAGNOSIS — F17210 Nicotine dependence, cigarettes, uncomplicated: Secondary | ICD-10-CM | POA: Diagnosis not present

## 2018-09-23 DIAGNOSIS — K2970 Gastritis, unspecified, without bleeding: Secondary | ICD-10-CM | POA: Insufficient documentation

## 2018-09-23 DIAGNOSIS — R1084 Generalized abdominal pain: Secondary | ICD-10-CM | POA: Diagnosis present

## 2018-09-23 DIAGNOSIS — K2900 Acute gastritis without bleeding: Secondary | ICD-10-CM

## 2018-09-23 LAB — URINALYSIS, ROUTINE W REFLEX MICROSCOPIC
Bacteria, UA: NONE SEEN
Bilirubin Urine: NEGATIVE
Glucose, UA: NEGATIVE mg/dL
Hgb urine dipstick: NEGATIVE
Ketones, ur: 5 mg/dL — AB
Nitrite: NEGATIVE
Protein, ur: NEGATIVE mg/dL
Specific Gravity, Urine: 1.031 — ABNORMAL HIGH (ref 1.005–1.030)
pH: 9 — ABNORMAL HIGH (ref 5.0–8.0)

## 2018-09-23 LAB — COMPREHENSIVE METABOLIC PANEL
ALT: 21 U/L (ref 0–44)
AST: 24 U/L (ref 15–41)
Albumin: 4.8 g/dL (ref 3.5–5.0)
Alkaline Phosphatase: 46 U/L (ref 38–126)
Anion gap: 11 (ref 5–15)
BUN: 12 mg/dL (ref 6–20)
CO2: 22 mmol/L (ref 22–32)
Calcium: 10 mg/dL (ref 8.9–10.3)
Chloride: 105 mmol/L (ref 98–111)
Creatinine, Ser: 1.05 mg/dL (ref 0.61–1.24)
GFR calc Af Amer: >60 mL/min (ref >60)
GFR calc non Af Amer: >60 mL/min (ref >60)
Glucose, Bld: 116 mg/dL — ABNORMAL HIGH (ref 70–99)
Potassium: 3.9 mmol/L (ref 3.5–5.1)
Sodium: 138 mmol/L (ref 135–145)
Total Bilirubin: 1.6 mg/dL — ABNORMAL HIGH (ref 0.3–1.2)
Total Protein: 7.6 g/dL (ref 6.5–8.1)

## 2018-09-23 LAB — CBC
HCT: 45.9 % (ref 39.0–52.0)
Hemoglobin: 16 g/dL (ref 13.0–17.0)
MCH: 31.6 pg (ref 26.0–34.0)
MCHC: 34.9 g/dL (ref 30.0–36.0)
MCV: 90.5 fL (ref 80.0–100.0)
Platelets: 241 10*3/uL (ref 150–400)
RBC: 5.07 MIL/uL (ref 4.22–5.81)
RDW: 12.1 % (ref 11.5–15.5)
WBC: 11.2 10*3/uL — ABNORMAL HIGH (ref 4.0–10.5)
nRBC: 0 % (ref 0.0–0.2)

## 2018-09-23 LAB — POC OCCULT BLOOD, ED: Fecal Occult Bld: NEGATIVE

## 2018-09-23 LAB — LIPASE, BLOOD: Lipase: 26 U/L (ref 11–51)

## 2018-09-23 LAB — MAGNESIUM: Magnesium: 1.9 mg/dL (ref 1.7–2.4)

## 2018-09-23 MED ORDER — IOHEXOL 300 MG/ML  SOLN
100.0000 mL | Freq: Once | INTRAMUSCULAR | Status: AC | PRN
  Administered 2018-09-23: 100 mL via INTRAVENOUS

## 2018-09-23 MED ORDER — FAMOTIDINE IN NACL 20-0.9 MG/50ML-% IV SOLN
20.0000 mg | Freq: Once | INTRAVENOUS | Status: AC
  Administered 2018-09-23: 13:00:00 20 mg via INTRAVENOUS
  Filled 2018-09-23: qty 50

## 2018-09-23 MED ORDER — SODIUM CHLORIDE 0.9 % IV SOLN
1000.0000 mL | INTRAVENOUS | Status: DC
  Administered 2018-09-23: 14:00:00 1000 mL via INTRAVENOUS

## 2018-09-23 MED ORDER — HYDROMORPHONE HCL 1 MG/ML IJ SOLN
0.5000 mg | Freq: Once | INTRAMUSCULAR | Status: AC
  Administered 2018-09-23: 14:00:00 0.5 mg via INTRAVENOUS
  Filled 2018-09-23: qty 1

## 2018-09-23 MED ORDER — FAMOTIDINE 20 MG PO TABS: 20.0000 mg | ORAL_TABLET | Freq: Two times a day (BID) | ORAL | 1 refills | Status: DC

## 2018-09-23 MED ORDER — ONDANSETRON HCL 4 MG/2ML IJ SOLN
4.0000 mg | Freq: Once | INTRAMUSCULAR | Status: AC | PRN
  Administered 2018-09-23: 11:00:00 4 mg via INTRAVENOUS
  Filled 2018-09-23: qty 2

## 2018-09-23 MED ORDER — SODIUM CHLORIDE 0.9 % IV BOLUS (SEPSIS)
1000.0000 mL | Freq: Once | INTRAVENOUS | Status: AC
  Administered 2018-09-23: 12:00:00 1000 mL via INTRAVENOUS

## 2018-09-23 MED ORDER — MORPHINE SULFATE (PF) 4 MG/ML IV SOLN
4.0000 mg | Freq: Once | INTRAVENOUS | Status: AC
  Administered 2018-09-23: 4 mg via INTRAVENOUS
  Filled 2018-09-23: qty 1

## 2018-09-23 MED ORDER — SODIUM CHLORIDE 0.9% FLUSH: 3.0000 mL | Freq: Once | INTRAVENOUS | Status: DC

## 2018-09-23 MED ORDER — PANTOPRAZOLE SODIUM 20 MG PO TBEC: 40.0000 mg | DELAYED_RELEASE_TABLET | Freq: Every day | ORAL | 1 refills | Status: DC

## 2018-09-23 MED ORDER — PROCHLORPERAZINE EDISYLATE 10 MG/2ML IJ SOLN
5.0000 mg | Freq: Once | INTRAMUSCULAR | Status: AC
  Administered 2018-09-23: 13:00:00 5 mg via INTRAVENOUS
  Filled 2018-09-23: qty 2

## 2018-09-23 MED ORDER — PANTOPRAZOLE SODIUM 40 MG IV SOLR
40.0000 mg | Freq: Once | INTRAVENOUS | Status: AC
  Administered 2018-09-23: 40 mg via INTRAVENOUS
  Filled 2018-09-23: qty 40

## 2018-09-23 MED ORDER — PROMETHAZINE HCL 25 MG PO TABS: 25.0000 mg | ORAL_TABLET | Freq: Four times a day (QID) | ORAL | 0 refills | Status: DC | PRN

## 2018-09-23 NOTE — Discharge Instructions (Addendum)
Your vital signs are stable.  Your lab work is reassuring with no acute changes noted.  Your stool is negative for hidden blood at this time.The CT scan of your abdomen is negative for acute changes.  Your examination favors gastritis.  The possibility of some pancreatitis cannot be ruled out at this time.  Please refrain from alcohol.  Please use Pepcid 2 times daily, and use Protonix daily.  Use promethazine every 6 hours as needed for nausea/vomiting.  Promethazine may cause drowsiness, and or lightheadedness.  Please do not drive a vehicle, operate machinery, drink alcohol, or participate in activities requiring concentration when taking this medication.  Please see Dr. Gala Romney or a member of his team for GI evaluation concerning your pain and nausea.

## 2018-09-23 NOTE — ED Triage Notes (Addendum)
Patient c/o mid abd pain that started middle of the night. Patient came to ED and was waiting in waiting area when pain eased and he left. Pain returned this morning with nausea, vomiting, and diarrhea. Denies any blood in stool or urinary symptoms. Unsure of any fevers. Patient reports similar pain in past where he was told it was due to inflamed gallbladder. Patient tried to take Tums but vomited shortly after. Belching helps relieve pain briefly.

## 2018-09-23 NOTE — ED Provider Notes (Signed)
[facility] EMERGENCY DEPARTMENT Provider Note   CSN: [id number] Arrival date & time: 09/23/18  1020     History   Chief Complaint Chief Complaint  Patient presents with  . Abdominal Pain    HPI Jacob Bradley is a 23 y.o. male.     Patient is a 23 year old male who presents to the emergency department with complaint of abdominal pain.  The patient states that this problem started a little before 3 AM.  He started having a squeezing type pain in his abdomen.  He tried Tums, and he tried some [place] DM because he thought he had some congestion, but he soon vomited these up.  He came to the emergency department, but while waiting in the emergency room earlier this morning he was feeling better and so he went back home.  After arriving home he started having abdominal pain and vomiting again.  He says it feels mildly improved if he does some belching, but the pain comes right back after belching.  He continues to have vomiting so he came back into the emergency department for assistance with his pain and his vomiting.  The patient states that he had 1 or 2 diarrhea stools.  He denies seeing any blood in his stools and denies seeing any blood in the vomitus.  He has not had any recent changes in medications he has not had any recent changes in diet.  He is not had any known fever.  He has not had any procedures involving the abdomen.  The patient denies excessive use of NSAIDs.  He says that he is a smoker.  He also states that he has alcohol occasionally.  He says he had about 2 shots of absolute vodka on last evening.  He uses marijuana occasionally.  He has not had any recent injury to the abdomen.  He presents now for assistance with this issue.  The history is provided by the patient.  Abdominal Pain Associated symptoms: diarrhea, nausea and vomiting   Associated symptoms: no chest pain, no chills, no constipation, no cough, no dysuria, no fever, no hematuria and no shortness of breath      Past Medical History:  Diagnosis Date  . Bipolar disorder [facility](HCC)     Patient Active Problem List   Diagnosis Date Noted  . Recurrent major depression-severe (HCC) 09/20/2011    Class: Diagnosis of  . Oppositional defiant disorder 09/20/2011    Class: Diagnosis of  . Suicidal ideations 09/18/2011  . Unspecified episodic mood disorder 09/18/2011  . Polysubstance abuse (HCC) 09/18/2011    History reviewed. No pertinent surgical history.      Home Medications    Prior to Admission medications   Medication Sig Start Date End Date Taking? Authorizing Provider  amoxicillin-clavulanate (AUGMENTIN) 875-125 MG tablet Take 1 tablet by mouth every 12 (twelve) hours. 11/21/15   Eber HongMiller, Brian, MD  bacitracin ointment Apply 1 application topically 2 (two) times daily. 11/21/15   Eber HongMiller, Brian, MD  famotidine (PEPCID) 20 MG tablet Take 1 tablet (20 mg total) by mouth 2 (two) times daily. 05/18/18   Pricilla LovelessGoldston, Scott, MD  ibuprofen (ADVIL,MOTRIN) 800 MG tablet Take 1 tablet (800 mg total) by mouth 3 (three) times daily. 11/21/15   Eber HongMiller, Brian, MD  ondansetron (ZOFRAN ODT) 4 MG disintegrating tablet Take 1 tablet (4 mg total) by mouth every 8 (eight) hours as needed for nausea or vomiting. 05/19/18   Pricilla LovelessGoldston, Scott, MD  pantoprazole (PROTONIX) 40 MG tablet Take 1 tablet (  40 mg total) by mouth daily. 05/18/18   Sherwood Gambler, MD  promethazine (PHENERGAN) 25 MG tablet Take 1 tablet (25 mg total) by mouth every 6 (six) hours as needed for nausea or vomiting. 05/19/18   Sherwood Gambler, MD    Family History History reviewed. No pertinent family history.  Social History Social History   Tobacco Use  . Smoking status: Current Every Day Smoker    Packs/day: 1.00    Years: 8.00    Pack years: 8.00    Types: Cigarettes  . Smokeless tobacco: Never Used  Substance Use Topics  . Alcohol use: Yes    Frequency: Never    Comment: Rarely  . Drug use: Yes    Types: Marijuana    Comment: in the past       Allergies   Patient has no known allergies.   Review of Systems Review of Systems  Constitutional: Negative for activity change, appetite change, chills and fever.  HENT: Positive for congestion. Negative for ear discharge, ear pain, facial swelling, nosebleeds, rhinorrhea, sneezing and tinnitus.   Eyes: Negative for photophobia, pain and discharge.  Respiratory: Negative for cough, choking, shortness of breath and wheezing.   Cardiovascular: Negative for chest pain, palpitations and leg swelling.  Gastrointestinal: Positive for abdominal pain, diarrhea, nausea and vomiting. Negative for blood in stool and constipation.  Genitourinary: Negative for difficulty urinating, dysuria, flank pain, frequency and hematuria.  Musculoskeletal: Negative for back pain, gait problem, myalgias and neck pain.  Skin: Negative for color change, rash and wound.  Neurological: Positive for light-headedness. Negative for dizziness, seizures, syncope, facial asymmetry, speech difficulty, weakness and numbness.  Hematological: Negative for adenopathy. Does not bruise/bleed easily.  Psychiatric/Behavioral: Negative for agitation, confusion, hallucinations, self-injury and suicidal ideas. The patient is not nervous/anxious.      Physical Exam Updated Vital Signs BP (!) 143/98 (BP Location: Right Arm)   Pulse 71   Temp 97.9 F (36.6 C) (Oral)   Resp 18   Ht 5' 4.5" (1.638 m)   Wt 76.7 kg   SpO2 100%   BMI 28.56 kg/m   Physical Exam Vitals signs and nursing note reviewed.  Constitutional:      Appearance: He is well-developed. He is not toxic-appearing.  HENT:     Head: Normocephalic.     Right Ear: Tympanic membrane and external ear normal.     Left Ear: Tympanic membrane and external ear normal.  Eyes:     General: Lids are normal.     Pupils: Pupils are equal, round, and reactive to light.  Neck:     Musculoskeletal: Normal range of motion and neck supple.     Vascular: No carotid  bruit.  Cardiovascular:     Rate and Rhythm: Normal rate and regular rhythm.     Pulses: Normal pulses.     Heart sounds: Normal heart sounds.  Pulmonary:     Effort: No accessory muscle usage, respiratory distress or retractions.     Breath sounds: Wheezing present.     Comments: Occasional expiratory wheeze Abdominal:     General: Bowel sounds are normal.     Palpations: Abdomen is soft.     Tenderness: There is generalized abdominal tenderness.     Comments: The patient has generalized abdominal pain.  The pain is most intense in the epigastric area.  Musculoskeletal: Normal range of motion.  Lymphadenopathy:     Head:     Right side of head: No submandibular adenopathy.  Left side of head: No submandibular adenopathy.     Cervical: No cervical adenopathy.  Skin:    General: Skin is warm and dry.  Neurological:     Mental Status: He is alert and oriented to person, place, and time.     Cranial Nerves: No cranial nerve deficit.     Sensory: No sensory deficit.  Psychiatric:        Speech: Speech normal.      ED Treatments / Results  Labs (all labs ordered are listed, but only abnormal results are displayed) Labs Reviewed  LIPASE, BLOOD  COMPREHENSIVE METABOLIC PANEL  CBC  URINALYSIS, ROUTINE W REFLEX MICROSCOPIC  MAGNESIUM  POC OCCULT BLOOD, ED    EKG None  Radiology No results found.  Procedures Procedures (including critical care time)  Medications Ordered in ED Medications  sodium chloride flush (NS) 0.9 % injection 3 mL (3 mLs Intravenous Not Given 09/23/18 1043)  ondansetron (ZOFRAN) injection 4 mg (has no administration in time range)  morphine 4 MG/ML injection 4 mg (has no administration in time range)     Initial Impression / Assessment and Plan / ED Course  I have reviewed the triage vital signs and the nursing notes.  Pertinent labs & imaging results that were available during my care of the patient were reviewed by me and considered in my  medical decision making (see chart for details).          Final Clinical Impressions(s) / ED Diagnoses MDM  Vital signs within normal limits.  Pulse oximetry is 100% on room air.  Within normal limits by my interpretation.  Patient having active vomiting in the department.  Patient treated with IV Zofran.  Patient having diffuse abdominal pain, worse in the epigastric area, IV pain medication given.  Magnesium is normal at 1.9, the lipase is normal at 26.  The comprehensive metabolic panel is within normal limits.  The anion gap is normal at 11. The complete blood count shows a slight elevation of the white blood cells of 11,200, otherwise within normal limits.  CT scan of the abdomen shows no acute abnormality.  There is no evidence of pancreatitis at this time. Recheck.  Patient states he is still having nausea and vomiting.  IV Compazine given to the patient.  Recheck patient is moaning and groaning.  Says that he still has some nausea, but having pain as well.  Will use Dilaudid 0.5 mg IV. Recheck. Pt feeling much better. No vomiting. Nausea and pain improved. Pt ambulatory without problem  Pt referred to GI. Rx for pepcid, protonix, and phenergan given to the patient.   Final diagnoses:  Acute gastritis, presence of bleeding unspecified, unspecified gastritis type    ED Discharge Orders    None       Ivery QualeBryant, Rayne Loiseau, PA-C 09/26/18 0935    Jacalyn LefevreHaviland, Julie, MD 09/26/18 [phone]

## 2018-10-03 ENCOUNTER — Encounter: Payer: Self-pay | Admitting: Gastroenterology

## 2018-11-05 ENCOUNTER — Ambulatory Visit: Payer: 59 | Admitting: Gastroenterology

## 2018-11-06 ENCOUNTER — Other Ambulatory Visit: Payer: Self-pay

## 2018-11-06 ENCOUNTER — Ambulatory Visit (INDEPENDENT_AMBULATORY_CARE_PROVIDER_SITE_OTHER): Payer: 59 | Admitting: Gastroenterology

## 2018-11-06 ENCOUNTER — Encounter: Payer: Self-pay | Admitting: Gastroenterology

## 2018-11-06 ENCOUNTER — Telehealth: Payer: Self-pay

## 2018-11-06 VITALS — BP 128/66 | HR 69 | Temp 96.8°F | Ht 63.0 in | Wt 167.4 lb

## 2018-11-06 DIAGNOSIS — K625 Hemorrhage of anus and rectum: Secondary | ICD-10-CM

## 2018-11-06 DIAGNOSIS — R1013 Epigastric pain: Secondary | ICD-10-CM

## 2018-11-06 DIAGNOSIS — R197 Diarrhea, unspecified: Secondary | ICD-10-CM

## 2018-11-06 MED ORDER — PEG 3350-KCL-NA BICARB-NACL 420 G PO SOLR: 4000.0000 mL | ORAL | 0 refills | Status: DC

## 2018-11-06 MED ORDER — PANTOPRAZOLE SODIUM 40 MG PO TBEC: 40.0000 mg | DELAYED_RELEASE_TABLET | Freq: Two times a day (BID) | ORAL | 3 refills | Status: DC

## 2018-11-06 MED ORDER — DICYCLOMINE HCL 10 MG PO CAPS: 10.0000 mg | ORAL_CAPSULE | Freq: Four times a day (QID) | ORAL | 1 refills | Status: DC | PRN

## 2018-11-06 NOTE — Patient Instructions (Addendum)
1. Colonoscopy as scheduled. See separate instructions.  2. Please go for labs.  3. Start pantoprazole twice daily before a meal for upper abdominal pain. TAKE EVERY DAY. 4. Start bentyl up to four times daily for abdominal cramps and loose stool.  5. Return to the office in 8 weeks, we will consider scheduling upper endoscopy at that time if needed.

## 2018-11-06 NOTE — Progress Notes (Signed)
Primary Care Physician:  Patient, No Pcp Per  Primary Gastroenterologist:  Roetta Sessions, MD   Chief Complaint  Patient presents with  . Abdominal Pain  . Nausea    in the mornings    HPI:  Jacob Bradley is a 23 y.o. male here at the advice of the [facility] ED for further evaluation of abdominal pain, nausea/vomiting, diarrhea.   Patient reports symptoms began back in 04/2018. Seen in ED at that time. CT A/P with contrast was unremarkable. Labs essentially unremarkable except for minimally elevated WBC. RUQ U/S normal. He was told he had gastritis. He has had some intermittent symptoms since then but more pronounced since 10/18/18. Went back to ED. A second CT A/P unremarkable. Labs again unremarkable except for minimally elevated WBC.   Patient states that he stopped etoh after his October 18, 2018 ED visit. He notes that his vomiting has resolved. His heartburn is better. Still having epigastric burning. He is having frequent nocturnal diarrhea, associated with abdominal pain. Having 6 stools a day. Prior to 10/18/2018 was having only one BM daily. Having brbpr on daily basis. No melena. No dysphagia. Had coffee ground emesis during his last ED visit. Denies NSAIDS/ASA. Has been avoiding spicy foods. Cut way back on energy drinks. Marijuana since age 84. Uses 2-3 week.     No FH IBD, celiac disease, colon cancer. Mother has cirrhosis.    No current outpatient medications on file.   No current facility-administered medications for this visit.     Allergies as of 11/06/2018  . (No Known Allergies)    Past Medical History:  Diagnosis Date  . Bipolar disorder [facility])     Past Surgical History:  Procedure Laterality Date  . none      Family History  Problem Relation Age of Onset  . Cirrhosis Mother        etoh  . Colon cancer Neg Hx   . Inflammatory bowel disease Neg Hx   . Celiac disease Neg Hx     Social History   Socioeconomic History  . Marital status: Married    Spouse name: Not on  file  . Number of children: Not on file  . Years of education: Not on file  . Highest education level: Not on file  Occupational History  . Not on file  Social Needs  . Financial resource strain: Not on file  . Food insecurity    Worry: Not on file    Inability: Not on file  . Transportation needs    Medical: Not on file    Non-medical: Not on file  Tobacco Use  . Smoking status: Current Every Day Smoker    Packs/day: 1.00    Years: 8.00    Pack years: 8.00    Types: Cigarettes  . Smokeless tobacco: Never Used  Substance and Sexual Activity  . Alcohol use: Yes    Frequency: Never    Comment: Rarely, never heavy  . Drug use: Yes    Types: Marijuana    Comment: several days per week since "age 67"  . Sexual activity: Not on file  Lifestyle  . Physical activity    Days per week: Not on file    Minutes per session: Not on file  . Stress: Not on file  Relationships  . Social Musician on phone: Not on file    Gets together: Not on file    Attends religious service: Not on file    Active member  of club or organization: Not on file    Attends meetings of clubs or organizations: Not on file    Relationship status: Not on file  . Intimate partner violence    Fear of current or ex partner: Not on file    Emotionally abused: Not on file    Physically abused: Not on file    Forced sexual activity: Not on file  Other Topics Concern  . Not on file  Social History Narrative  . Not on file      ROS:  General: Negative for anorexia, weight loss, fever, chills, fatigue, weakness. Eyes: Negative for vision changes.  ENT: Negative for hoarseness, difficulty swallowing , nasal congestion. CV: Negative for chest pain, angina, palpitations, dyspnea on exertion, peripheral edema.  Respiratory: Negative for dyspnea at rest, dyspnea on exertion, cough, sputum, wheezing.  GI: See history of present illness. GU:  Negative for dysuria, hematuria, urinary incontinence,  urinary frequency, nocturnal urination.  MS: Negative for joint pain, low back pain.  Derm: Negative for rash or itching.  Neuro: Negative for weakness, abnormal sensation, seizure, frequent headaches, memory loss, confusion.  Psych: Negative for anxiety, depression, suicidal ideation, hallucinations.  Endo: Negative for unusual weight change.  Heme: Negative for bruising or bleeding. Allergy: Negative for rash or hives.    Physical Examination:  BP 128/66   Pulse 69   Temp (!) 96.8 F (36 C) (Oral)   Ht 5\' 3"  (1.6 m)   Wt 167 lb 6.4 oz (75.9 kg)   BMI 29.65 kg/m    General: Well-nourished, well-developed in no acute distress.  Head: Normocephalic, atraumatic.   Eyes: Conjunctiva pink, no icterus. Mouth: Oropharyngeal mucosa moist and pink , no lesions erythema or exudate. Neck: Supple without thyromegaly, masses, or lymphadenopathy.  Lungs: Clear to auscultation bilaterally.  Heart: Regular rate and rhythm, no murmurs rubs or gallops.  Abdomen: Bowel sounds are normal, moderate epigastric tenderness, nondistended, no hepatosplenomegaly or masses, no abdominal bruits or    hernia , no rebound or guarding.   Rectal: not performed.  Extremities: No lower extremity edema. No clubbing or deformities.  Neuro: Alert and oriented x 4 , grossly normal neurologically.  Skin: Warm and dry, no rash or jaundice.   Psych: Alert and cooperative, normal mood and affect.  Labs: Lab Results  Component Value Date   CREATININE 1.05 09/23/2018   BUN 12 09/23/2018   NA 138 09/23/2018   K 3.9 09/23/2018   CL 105 09/23/2018   CO2 22 09/23/2018   Lab Results  Component Value Date   ALT 21 09/23/2018   AST 24 09/23/2018   ALKPHOS 46 09/23/2018   BILITOT 1.6 (H) 09/23/2018   Lab Results  Component Value Date   WBC 11.2 (H) 09/23/2018   HGB 16.0 09/23/2018   HCT 45.9 09/23/2018   MCV 90.5 09/23/2018   PLT 241 09/23/2018   Lab Results  Component Value Date   LIPASE 26 09/23/2018      Imaging Studies: No results found.  Impression/plan:  23 year old male with history of chronic marijuana use presenting for further evaluation of several month history of epigastric pain, nausea/vomiting, diarrhea, rectal bleeding.  Patient states he had been drinking a little bit of alcohol which he stopped back in August.  Denies frequent or heavy use.  Denies chronic use.  He notes that his heartburn and vomiting improved after stopping alcohol use.  He continues to have all his other symptoms.  Been taking some Pepcid and Protonix  off and on provided by the ED.  Complains of ongoing frequent postprandial abdominal pain, nausea.  5-6 stools daily, daily hematochezia.  Denies NSAID or aspirin use.  Differential diagnosis includes GERD, gastritis, peptic ulcer disease, IBD, celiac disease.  Rectal bleeding could be due to IBD benign anorectal source.  Initially planned for colonoscopy and based on findings possible endoscopy at a later date because of scheduling issues.  Fortunately we had a cancellation and patient will have colonoscopy along with EGD with propofol in the near future.  I have discussed the risks, alternatives, benefits with regards to but not limited to the risk of reaction to medication, bleeding, infection, perforation and the patient is agreeable to proceed. Written consent to be obtained.  We will obtain labs to screen for celiac disease.  Start pantoprazole 40 mg twice daily.  Trial of Bentyl 10 mg 4 times daily as needed for cramps and loose stool.

## 2018-11-06 NOTE — Telephone Encounter (Signed)
Called pt, TCS/EGD (EGD added per LSL) with Propofol w/RMR scheduled for 11/29/18 at 9:30am. Rx for prep sent to pharmacy. Orders entered.

## 2018-11-06 NOTE — Telephone Encounter (Signed)
Attempted to submit PA for TCS/EGD via [facility] website. Per [facility] website: Member Plan covers preventative services/benefits only!

## 2018-11-07 NOTE — Telephone Encounter (Signed)
Pre-op appt 11/27/18 at 8:00am, COVID test 11/27/18 at 9:05am. Appt letter mailed with procedure instructions.

## 2018-11-09 NOTE — Progress Notes (Signed)
No pcp per pt °

## 2018-11-20 ENCOUNTER — Other Ambulatory Visit: Payer: Self-pay

## 2018-11-27 ENCOUNTER — Other Ambulatory Visit: Payer: Self-pay

## 2018-11-27 ENCOUNTER — Telehealth: Payer: Self-pay | Admitting: *Deleted

## 2018-11-27 ENCOUNTER — Other Ambulatory Visit (HOSPITAL_COMMUNITY)
Admission: RE | Admit: 2018-11-27 | Discharge: 2018-11-27 | Disposition: A | Discharge status: Home / Self Care | Payer: 59 | Source: Ambulatory Visit | Attending: Internal Medicine | Admitting: Internal Medicine

## 2018-11-27 ENCOUNTER — Encounter (HOSPITAL_COMMUNITY)
Admission: RE | Admit: 2018-11-27 | Discharge: 2018-11-27 | Disposition: A | Discharge status: Home / Self Care | Payer: 59 | Source: Ambulatory Visit | Attending: Internal Medicine | Admitting: Internal Medicine

## 2018-11-27 NOTE — Telephone Encounter (Signed)
Spoke with patient and he stated he wants to cancel procedure. He states the cost is too much for him to have to pay for. I made patient aware he could make payments and the amount is not due upfront but he states he still wants to cancel. Called endo and LMOVM to cancel. FYI to LSL.

## 2018-11-27 NOTE — Telephone Encounter (Signed)
Noted. Pt aware 

## 2018-11-27 NOTE — Telephone Encounter (Signed)
Noted. Would recommend he complete labs as ordered.

## 2018-11-27 NOTE — Telephone Encounter (Signed)
Received message from [facility] in endo that patient wants to cancel his upcoming procedure with RMR on 11/29/2018.  Called pt-LMOVM

## 2018-11-29 ENCOUNTER — Encounter (HOSPITAL_COMMUNITY): Admission: RE | Payer: Self-pay | Source: Home / Self Care

## 2018-11-29 ENCOUNTER — Ambulatory Visit (HOSPITAL_COMMUNITY): Admission: RE | Admit: 2018-11-29 | Payer: 59 | Source: Home / Self Care | Admitting: Internal Medicine

## 2018-11-29 SURGERY — COLONOSCOPY WITH PROPOFOL
Anesthesia: Monitor Anesthesia Care

## 2019-01-01 ENCOUNTER — Telehealth: Payer: Self-pay | Admitting: Gastroenterology

## 2019-01-01 ENCOUNTER — Ambulatory Visit: Payer: 59 | Admitting: Gastroenterology

## 2019-01-01 ENCOUNTER — Encounter: Payer: Self-pay | Admitting: Gastroenterology

## 2019-01-01 NOTE — Telephone Encounter (Signed)
PATIENT WAS A NO SHOW AND LETTER SENT  °

## 2019-08-30 ENCOUNTER — Emergency Department (HOSPITAL_COMMUNITY): Payer: 59

## 2019-08-30 ENCOUNTER — Other Ambulatory Visit: Payer: Self-pay

## 2019-08-30 ENCOUNTER — Emergency Department (HOSPITAL_COMMUNITY)
Admission: EM | Admit: 2019-08-30 | Discharge: 2019-08-30 | Disposition: A | Discharge status: Home / Self Care | Payer: 59 | Attending: Emergency Medicine | Admitting: Emergency Medicine

## 2019-08-30 DIAGNOSIS — R112 Nausea with vomiting, unspecified: Secondary | ICD-10-CM | POA: Insufficient documentation

## 2019-08-30 DIAGNOSIS — F17210 Nicotine dependence, cigarettes, uncomplicated: Secondary | ICD-10-CM | POA: Insufficient documentation

## 2019-08-30 DIAGNOSIS — R197 Diarrhea, unspecified: Secondary | ICD-10-CM | POA: Insufficient documentation

## 2019-08-30 DIAGNOSIS — R1011 Right upper quadrant pain: Secondary | ICD-10-CM | POA: Insufficient documentation

## 2019-08-30 LAB — URINALYSIS, ROUTINE W REFLEX MICROSCOPIC
Bilirubin Urine: NEGATIVE
Glucose, UA: NEGATIVE mg/dL
Hgb urine dipstick: NEGATIVE
Ketones, ur: 80 mg/dL — AB
Leukocytes,Ua: NEGATIVE
Nitrite: NEGATIVE
Protein, ur: 100 mg/dL — AB
Specific Gravity, Urine: >1.046 — ABNORMAL HIGH (ref 1.005–1.030)
pH: 6 (ref 5.0–8.0)

## 2019-08-30 LAB — COMPREHENSIVE METABOLIC PANEL
ALT: 32 U/L (ref 0–44)
AST: 32 U/L (ref 15–41)
Albumin: 4.8 g/dL (ref 3.5–5.0)
Alkaline Phosphatase: 56 U/L (ref 38–126)
Anion gap: 15 (ref 5–15)
BUN: 16 mg/dL (ref 6–20)
CO2: 21 mmol/L — ABNORMAL LOW (ref 22–32)
Calcium: 10 mg/dL (ref 8.9–10.3)
Chloride: 105 mmol/L (ref 98–111)
Creatinine, Ser: 1.18 mg/dL (ref 0.61–1.24)
GFR calc Af Amer: >60 mL/min (ref >60)
GFR calc non Af Amer: >60 mL/min (ref >60)
Glucose, Bld: 133 mg/dL — ABNORMAL HIGH (ref 70–99)
Potassium: 4 mmol/L (ref 3.5–5.1)
Sodium: 141 mmol/L (ref 135–145)
Total Bilirubin: 2.2 mg/dL — ABNORMAL HIGH (ref 0.3–1.2)
Total Protein: 8.3 g/dL — ABNORMAL HIGH (ref 6.5–8.1)

## 2019-08-30 LAB — CBC
HCT: 47.1 % (ref 39.0–52.0)
Hemoglobin: 16.4 g/dL (ref 13.0–17.0)
MCH: 32.2 pg (ref 26.0–34.0)
MCHC: 34.8 g/dL (ref 30.0–36.0)
MCV: 92.4 fL (ref 80.0–100.0)
Platelets: 331 10*3/uL (ref 150–400)
RBC: 5.1 MIL/uL (ref 4.22–5.81)
RDW: 11.6 % (ref 11.5–15.5)
WBC: 12.1 10*3/uL — ABNORMAL HIGH (ref 4.0–10.5)
nRBC: 0 % (ref 0.0–0.2)

## 2019-08-30 LAB — LIPASE, BLOOD: Lipase: 21 U/L (ref 11–51)

## 2019-08-30 MED ORDER — ONDANSETRON 4 MG PO TBDP
8.0000 mg | ORAL_TABLET | Freq: Once | ORAL | Status: AC
  Administered 2019-08-30: 8 mg via ORAL
  Filled 2019-08-30: qty 2

## 2019-08-30 MED ORDER — PANTOPRAZOLE SODIUM 40 MG PO TBEC: 40.0000 mg | DELAYED_RELEASE_TABLET | Freq: Two times a day (BID) | ORAL | 3 refills | Status: DC

## 2019-08-30 MED ORDER — LIDOCAINE VISCOUS HCL 2 % MT SOLN
15.0000 mL | Freq: Once | OROMUCOSAL | Status: AC
  Administered 2019-08-30: 15 mL via ORAL
  Filled 2019-08-30: qty 15

## 2019-08-30 MED ORDER — SODIUM CHLORIDE 0.9 % IV BOLUS
1000.0000 mL | Freq: Once | INTRAVENOUS | Status: AC
  Administered 2019-08-30: 1000 mL via INTRAVENOUS

## 2019-08-30 MED ORDER — IOHEXOL 300 MG/ML  SOLN
100.0000 mL | Freq: Once | INTRAMUSCULAR | Status: AC | PRN
  Administered 2019-08-30: 100 mL via INTRAVENOUS

## 2019-08-30 MED ORDER — FUROSEMIDE 10 MG/ML IJ SOLN: 40.0000 mg | Freq: Once | INTRAMUSCULAR | Status: DC

## 2019-08-30 MED ORDER — ALUM & MAG HYDROXIDE-SIMETH 200-200-20 MG/5ML PO SUSP
30.0000 mL | Freq: Once | ORAL | Status: AC
  Administered 2019-08-30: 30 mL via ORAL
  Filled 2019-08-30: qty 30

## 2019-08-30 MED ORDER — FENTANYL CITRATE (PF) 100 MCG/2ML IJ SOLN
50.0000 ug | Freq: Once | INTRAMUSCULAR | Status: AC
  Administered 2019-08-30: 50 ug via INTRAVENOUS
  Filled 2019-08-30: qty 2

## 2019-08-30 MED ORDER — OXYCODONE-ACETAMINOPHEN 5-325 MG PO TABS
2.0000 | ORAL_TABLET | Freq: Once | ORAL | Status: AC
  Administered 2019-08-30: 2 via ORAL
  Filled 2019-08-30: qty 2

## 2019-08-30 MED ORDER — PANTOPRAZOLE SODIUM 20 MG PO TBEC
20.0000 mg | DELAYED_RELEASE_TABLET | Freq: Every day | ORAL | 0 refills | Status: DC
Start: 2019-08-30 — End: 2020-03-12

## 2019-08-30 NOTE — ED Triage Notes (Signed)
Pt here for eval of central abdominal pain that wakes him out of sleep accompanied by diarrhea every morning x 2 months. This morning at 0630 pain was worse than normal and has been vomiting.

## 2019-08-30 NOTE — ED Provider Notes (Signed)
[facility] EMERGENCY DEPARTMENT Provider Note   CSN: [id number] Arrival date & time: 08/30/19  1407     History Chief Complaint  Patient presents with  . Abdominal Pain    BRYLIN STANISLAWSKI is a 24 y.o. male.  HPI    24 year old male presents today complaining of abdominal pain.  He states that he has had epigastric abdominal pain for the past 2 months.  He states it is worse after he sleeps.  He states he eats before he goes to bed but waits approximately 30 minutes to go to sleep.  He then awakes 5 to 8 hours later with epigastric pain nausea vomiting and diarrhea.  He states this has occurred daily over the past 2 months but he has had worsening pain in the past 24 hours.  He has been seen and evaluated for this in the past although it was approximately a year ago when he was seen in the [facility] emergency department.  He has had 2 CTs for this.  He has had no definitive diagnosis.  Was thought to be gastritis or possibly peptic ulcer disease.  He was sent for follow-up with GI and arranged to have an endoscopy.  However, due to financial difficulties he did not follow through with that.  He reports that he was on Protonix but it did not really help so he quit taking that medication.  He smokes marijuana daily to help with the symptoms.  He reports some weight loss.  He denies any fever or chills. Past Medical History:  Diagnosis Date  . Bipolar disorder [facility])     Patient Active Problem List   Diagnosis Date Noted  . Abdominal pain, epigastric 11/06/2018  . Diarrhea 11/06/2018  . Rectal bleeding 11/06/2018  . Recurrent major depression-severe (HCC) 09/20/2011    Class: Diagnosis of  . Oppositional defiant disorder 09/20/2011    Class: Diagnosis of  . Suicidal ideations 09/18/2011  . Unspecified episodic mood disorder 09/18/2011  . Polysubstance abuse (HCC) 09/18/2011    Past Surgical History:  Procedure Laterality Date  . none         Family History    Problem Relation Age of Onset  . Cirrhosis Mother        etoh  . Colon cancer Neg Hx   . Inflammatory bowel disease Neg Hx   . Celiac disease Neg Hx     Social History   Tobacco Use  . Smoking status: Current Every Day Smoker    Packs/day: 1.00    Years: 8.00    Pack years: 8.00    Types: Cigarettes  . Smokeless tobacco: Never Used  Vaping Use  . Vaping Use: Former  Substance Use Topics  . Alcohol use: Yes    Comment: Rarely, never heavy  . Drug use: Yes    Types: Marijuana    Comment: several days per week since "age 33"    Home Medications Prior to Admission medications   Medication Sig Start Date End Date Taking? Authorizing Provider  dicyclomine (BENTYL) 10 MG capsule Take 1 capsule (10 mg total) by mouth 4 (four) times daily as needed for spasms (30 mins before meals and at bedtime as needed for diarrhea and abdominal cramps.). Patient not taking: Reported on 08/30/2019 11/06/18   Tiffany Kocher, PA-C  pantoprazole (PROTONIX) 40 MG tablet Take 1 tablet (40 mg total) by mouth 2 (two) times daily before a meal. Patient not taking: Reported on 08/30/2019 11/06/18  Tiffany Kocher, PA-C  polyethylene glycol-electrolytes (TRILYTE) 420 g solution Take 4,000 mLs by mouth as directed. Patient not taking: Reported on 08/30/2019 11/06/18   Rourk, Gerrit Friends, MD    Allergies    Patient has no known allergies.  Review of Systems   Review of Systems  All other systems reviewed and are negative.   Physical Exam Updated Vital Signs BP (!) 153/97   Pulse 63   Temp (!) 97.5 F (36.4 C) (Oral)   Resp 18   Ht 1.651 m (5\' 5" )   Wt 72.6 kg   SpO2 98%   BMI 26.63 kg/m   Physical Exam Vitals and nursing note reviewed.  Constitutional:      General: He is not in acute distress.    Appearance: He is well-developed. He is not ill-appearing.  HENT:     Head: Normocephalic.     Mouth/Throat:     Mouth: Mucous membranes are moist.  Eyes:     Extraocular Movements: Extraocular  movements intact.  Cardiovascular:     Rate and Rhythm: Normal rate and regular rhythm.  Abdominal:     General: Abdomen is flat. Bowel sounds are normal.     Palpations: Abdomen is soft.     Tenderness: There is abdominal tenderness in the right upper quadrant and epigastric area.  Skin:    General: Skin is warm and dry.     Capillary Refill: Capillary refill takes less than 2 seconds.  Neurological:     General: No focal deficit present.     Mental Status: He is alert.  Psychiatric:        Mood and Affect: Mood normal.     ED Results / Procedures / Treatments   Labs (all labs ordered are listed, but only abnormal results are displayed) Labs Reviewed  COMPREHENSIVE METABOLIC PANEL - Abnormal; Notable for the following components:      Result Value   CO2 21 (*)    Glucose, Bld 133 (*)    Total Protein 8.3 (*)    Total Bilirubin 2.2 (*)    All other components within normal limits  CBC - Abnormal; Notable for the following components:   WBC 12.1 (*)    All other components within normal limits  LIPASE, BLOOD  URINALYSIS, ROUTINE W REFLEX MICROSCOPIC    EKG None  Radiology No results found.  Procedures Procedures (including critical care time)  Medications Ordered in ED Medications  sodium chloride 0.9 % bolus 1,000 mL (has no administration in time range)  fentaNYL (SUBLIMAZE) injection 50 mcg (has no administration in time range)  alum & mag hydroxide-simeth (MAALOX/MYLANTA) 200-200-20 MG/5ML suspension 30 mL (has no administration in time range)    And  lidocaine (XYLOCAINE) 2 % viscous mouth solution 15 mL (has no administration in time range)    ED Course  I have reviewed the triage vital signs and the nursing notes.  Pertinent labs & imaging results that were available during my care of the patient were reviewed by me and considered in my medical decision making (see chart for details).    MDM Rules/Calculators/A&P                           24 year old male presents today with ongoing abdominal pain.  He has been seen and evaluated for this several times in the past and was referred to GI.  However, he abdicated care due to financial issues. Given his  ongoing symptoms strongly advise a follow-up with GI.  He is given referral to GI and started back on Protonix.  We have discussed return precautions and he voices understanding. Final Clinical Impression(s) / ED Diagnoses Final diagnoses:  Right upper quadrant abdominal pain    Rx / DC Orders ED Discharge Orders    None       Margarita Grizzle, MD 08/30/19 2238

## 2020-02-07 DIAGNOSIS — Z6826 Body mass index (BMI) 26.0-26.9, adult: Secondary | ICD-10-CM | POA: Diagnosis not present

## 2020-02-07 DIAGNOSIS — F17200 Nicotine dependence, unspecified, uncomplicated: Secondary | ICD-10-CM | POA: Diagnosis not present

## 2020-02-07 DIAGNOSIS — R1110 Vomiting, unspecified: Secondary | ICD-10-CM | POA: Diagnosis not present

## 2020-02-07 DIAGNOSIS — Z23 Encounter for immunization: Secondary | ICD-10-CM | POA: Diagnosis not present

## 2020-02-07 DIAGNOSIS — R109 Unspecified abdominal pain: Secondary | ICD-10-CM | POA: Diagnosis not present

## 2020-02-13 DIAGNOSIS — R7989 Other specified abnormal findings of blood chemistry: Secondary | ICD-10-CM | POA: Diagnosis not present

## 2020-02-13 DIAGNOSIS — K50914 Crohn's disease, unspecified, with abscess: Secondary | ICD-10-CM | POA: Diagnosis not present

## 2020-02-13 DIAGNOSIS — R21 Rash and other nonspecific skin eruption: Secondary | ICD-10-CM | POA: Diagnosis not present

## 2020-02-13 DIAGNOSIS — Z6826 Body mass index (BMI) 26.0-26.9, adult: Secondary | ICD-10-CM | POA: Diagnosis not present

## 2020-02-13 DIAGNOSIS — F17200 Nicotine dependence, unspecified, uncomplicated: Secondary | ICD-10-CM | POA: Diagnosis not present

## 2020-02-13 DIAGNOSIS — D72829 Elevated white blood cell count, unspecified: Secondary | ICD-10-CM | POA: Diagnosis not present

## 2020-02-13 DIAGNOSIS — R109 Unspecified abdominal pain: Secondary | ICD-10-CM | POA: Diagnosis not present

## 2020-02-24 ENCOUNTER — Encounter: Payer: Self-pay | Admitting: Internal Medicine

## 2020-02-24 DIAGNOSIS — R1013 Epigastric pain: Secondary | ICD-10-CM | POA: Diagnosis not present

## 2020-02-24 DIAGNOSIS — R1084 Generalized abdominal pain: Secondary | ICD-10-CM | POA: Diagnosis not present

## 2020-02-25 DIAGNOSIS — R21 Rash and other nonspecific skin eruption: Secondary | ICD-10-CM | POA: Diagnosis not present

## 2020-02-25 DIAGNOSIS — Z1389 Encounter for screening for other disorder: Secondary | ICD-10-CM | POA: Diagnosis not present

## 2020-02-25 DIAGNOSIS — Z1331 Encounter for screening for depression: Secondary | ICD-10-CM | POA: Diagnosis not present

## 2020-02-25 DIAGNOSIS — F17200 Nicotine dependence, unspecified, uncomplicated: Secondary | ICD-10-CM | POA: Diagnosis not present

## 2020-02-25 DIAGNOSIS — Z6826 Body mass index (BMI) 26.0-26.9, adult: Secondary | ICD-10-CM | POA: Diagnosis not present

## 2020-02-25 DIAGNOSIS — Z6825 Body mass index (BMI) 25.0-25.9, adult: Secondary | ICD-10-CM | POA: Diagnosis not present

## 2020-02-25 DIAGNOSIS — R109 Unspecified abdominal pain: Secondary | ICD-10-CM | POA: Diagnosis not present

## 2020-03-03 ENCOUNTER — Encounter: Payer: Self-pay | Admitting: Internal Medicine

## 2020-03-03 ENCOUNTER — Ambulatory Visit: Payer: Self-pay | Admitting: Internal Medicine

## 2020-03-12 ENCOUNTER — Encounter: Payer: Self-pay | Admitting: Gastroenterology

## 2020-03-12 ENCOUNTER — Ambulatory Visit (INDEPENDENT_AMBULATORY_CARE_PROVIDER_SITE_OTHER): Payer: BC Managed Care – PPO | Admitting: Gastroenterology

## 2020-03-12 VITALS — BP 118/70 | HR 82 | Ht 65.0 in | Wt 155.0 lb

## 2020-03-12 DIAGNOSIS — R112 Nausea with vomiting, unspecified: Secondary | ICD-10-CM | POA: Insufficient documentation

## 2020-03-12 DIAGNOSIS — R634 Abnormal weight loss: Secondary | ICD-10-CM | POA: Diagnosis not present

## 2020-03-12 DIAGNOSIS — R142 Eructation: Secondary | ICD-10-CM | POA: Diagnosis not present

## 2020-03-12 DIAGNOSIS — K625 Hemorrhage of anus and rectum: Secondary | ICD-10-CM

## 2020-03-12 DIAGNOSIS — K5900 Constipation, unspecified: Secondary | ICD-10-CM | POA: Insufficient documentation

## 2020-03-12 DIAGNOSIS — R110 Nausea: Secondary | ICD-10-CM | POA: Diagnosis not present

## 2020-03-12 DIAGNOSIS — R109 Unspecified abdominal pain: Secondary | ICD-10-CM | POA: Insufficient documentation

## 2020-03-12 MED ORDER — PLENVU 140 G PO SOLR: ORAL | 0 refills | Status: DC

## 2020-03-12 NOTE — Patient Instructions (Addendum)
If you are age 25 or older, your body mass index should be between 23-30. Your Body mass index is 25.79 kg/m. If this is out of the aforementioned range listed, please consider follow up with your Primary Care Provider.  If you are age 87 or younger, your body mass index should be between 19-25. Your Body mass index is 25.79 kg/m. If this is out of the aformentioned range listed, please consider follow up with your Primary Care Provider.   You have been scheduled for an endoscopy and colonoscopy. Please follow the written instructions given to you at your visit today. Please pick up your prep supplies at the pharmacy within the next 1-3 days. If you use inhalers (even only as needed), please bring them with you on the day of your procedure.  Please start Miralax once daily.   Thank you for choosing me and [facility].  Doug Sou, PA-C

## 2020-03-12 NOTE — Progress Notes (Signed)
03/12/2020 Jacob Bradley [id number] 02-23-1995   HISTORY OF PRESENT ILLNESS: This is a 25 year old male who is new to our office.  He has been referred here by his PCP, Dr. Mayford Knife, for evaluation regarding abdominal pain.  He tells me that his symptoms have been worse over the past year, but have been present for couple of years total.  He says that he does have some constipation and has to strain with bowel movements.  He does not take anything for constipation.  He says that after every bowel movement he has severe abdominal pain and nausea to the point that he has to take a long hot shower.  He says that it is a squeezing type pain in his mid abdomen and is unbearable.  He told me that he had to quit his job because of it.  He says it is just gotten progressively worse.  He does see occasional bright red blood with bowel movements, but it is not often and is not large amounts.  He tells me that he has lost 20 pounds.  He does report a lot of anxiety.  He admits that he does smoke marijuana to try to help with the pain.  He tells me that he is actually cut back on the amount of marijuana that he has been smoking.  He is on pantoprazole 40 mg daily and Levsin as needed.  He also reports a lot of belching, but denies any overt heartburn or reflux.  He has had 4 CT scans and 2 ultrasounds performed since March 2020 and they have all been unremarkable.  CBC, CMP, lipase all unremarkable except for a very mildly elevated total bilirubin, highest at 2.2.  Other LFTs completely normal.  He says that a lot of times even the smell of food will make him feel sick   Past Medical History:  Diagnosis Date  . Bipolar disorder [facility])    Past Surgical History:  Procedure Laterality Date  . none      reports that he has been smoking cigarettes. He has a 8.00 pack-year smoking history. He has never used smokeless tobacco. He reports current alcohol use. He reports current drug use. Drug: Marijuana. family  history includes Cirrhosis in his mother. No Known Allergies    Outpatient Encounter Medications as of 03/12/2020  Medication Sig  . hyoscyamine (LEVSIN SL) 0.125 MG SL tablet Take by mouth every 4 (four) hours as needed.  . pantoprazole (PROTONIX) 40 MG tablet Take 1 tablet (40 mg total) by mouth 2 (two) times daily before a meal.  . [DISCONTINUED] dicyclomine (BENTYL) 10 MG capsule Take 1 capsule (10 mg total) by mouth 4 (four) times daily as needed for spasms (30 mins before meals and at bedtime as needed for diarrhea and abdominal cramps.). (Patient not taking: Reported on 08/30/2019)  . [DISCONTINUED] pantoprazole (PROTONIX) 20 MG tablet Take 1 tablet (20 mg total) by mouth daily.  . [DISCONTINUED] polyethylene glycol-electrolytes (TRILYTE) 420 g solution Take 4,000 mLs by mouth as directed. (Patient not taking: Reported on 08/30/2019)   No facility-administered encounter medications on file as of 03/12/2020.     REVIEW OF SYSTEMS  : All other systems reviewed and negative except where noted in the History of Present Illness.   PHYSICAL EXAM: BP 118/70   Pulse 82   Ht 5\' 5"  (1.651 m)   Wt 155 lb (70.3 kg)   BMI 25.79 kg/m  General: Well developed white male in no acute distress  Head: Normocephalic and atraumatic Eyes:  Sclerae anicteric, conjunctiva pink. Ears: Normal auditory acuity Lungs: Bradley throughout to auscultation; no W/R/R. Heart: Regular rate and rhythm; no M/R/G. Abdomen: Soft, non-distended.  BS present.  Non-tender. Rectal:  Will be done at the time of colonoscopy. Musculoskeletal: Symmetrical with no gross deformities  Skin: No lesions on visible extremities Extremities: No edema  Neurological: Alert oriented x 4, grossly non-focal Psychological:  Alert and cooperative. Normal mood and affect  ASSESSMENT AND PLAN: *25 year old male with multiple GI complaints including constipation, severe abdominal pain, nausea, rectal bleeding, approximately 20 pound weight  loss, belching.  Denies overt heartburn and reflux.  Symptoms so debilitating that he had to quit his job.  Symptoms have been present for about the past 2 years but worse over the past year.  He has had 4 CT scans of the abdomen and pelvis and 2 ultrasounds performed since March 2020 that have all been unremarkable.  We will plan for both EGD and colonoscopy to rule out GERD, IBD, etc.  I think that this is likely IBS as he reports a lot of anxiety as well.  The only thing that does not fit is the weight loss.  We will schedule his procedures with Dr. Barron Alvine.  The risks, benefits, and alternatives to EGD and colonoscopy were discussed with the patient and he consents to proceed.  I have asked him to begin taking MiraLAX daily to start with.  CC:  Donetta Potts, MD

## 2020-03-13 NOTE — Progress Notes (Signed)
Agree with the assessment and plan as outlined by Doug Sou, PA-C.  Given weight loss and persistent sxs with unremarkable w/u to date, agree with plan for EGD/Colonoscopy to r/o mucosal/luminal pathology. With that said, the resolution/improvement of sxs with hot shower certainly suspicious for cannabinoid-induced GI sxs, and strongly recommend complete cessation of all marijuana for diagnostic/therapeutic intent. Certainly could have IBS overlap, or IBS as pre-existing issue, with cannabinoid-driven exacerbation.   Reviewed prior labs. Persistent, stable elevated Tbili, with fractionization in 2020 confirming all indirect Bili. With otherwise normal LAEs and imaging, this is c/w Gilbert's Syndrome and no further w/u needed.    Prithvi Kooi, DO, [facility]

## 2020-03-23 ENCOUNTER — Encounter: Payer: Self-pay | Admitting: Gastroenterology

## 2020-03-23 DIAGNOSIS — Z1159 Encounter for screening for other viral diseases: Secondary | ICD-10-CM | POA: Diagnosis not present

## 2020-03-25 ENCOUNTER — Encounter: Payer: Self-pay | Admitting: Gastroenterology

## 2020-03-25 ENCOUNTER — Other Ambulatory Visit: Payer: Self-pay

## 2020-03-25 ENCOUNTER — Ambulatory Visit (AMBULATORY_SURGERY_CENTER): Payer: BC Managed Care – PPO | Admitting: Gastroenterology

## 2020-03-25 VITALS — BP 133/80 | HR 53 | Temp 97.8°F | Resp 16 | Ht 65.0 in | Wt 155.0 lb

## 2020-03-25 DIAGNOSIS — R142 Eructation: Secondary | ICD-10-CM | POA: Diagnosis not present

## 2020-03-25 DIAGNOSIS — K5900 Constipation, unspecified: Secondary | ICD-10-CM | POA: Diagnosis not present

## 2020-03-25 DIAGNOSIS — K2970 Gastritis, unspecified, without bleeding: Secondary | ICD-10-CM

## 2020-03-25 DIAGNOSIS — R110 Nausea: Secondary | ICD-10-CM | POA: Diagnosis not present

## 2020-03-25 DIAGNOSIS — K625 Hemorrhage of anus and rectum: Secondary | ICD-10-CM | POA: Diagnosis not present

## 2020-03-25 DIAGNOSIS — K2950 Unspecified chronic gastritis without bleeding: Secondary | ICD-10-CM | POA: Diagnosis not present

## 2020-03-25 DIAGNOSIS — K641 Second degree hemorrhoids: Secondary | ICD-10-CM | POA: Diagnosis not present

## 2020-03-25 DIAGNOSIS — K5730 Diverticulosis of large intestine without perforation or abscess without bleeding: Secondary | ICD-10-CM | POA: Diagnosis not present

## 2020-03-25 DIAGNOSIS — R634 Abnormal weight loss: Secondary | ICD-10-CM | POA: Diagnosis not present

## 2020-03-25 DIAGNOSIS — R109 Unspecified abdominal pain: Secondary | ICD-10-CM

## 2020-03-25 MED ORDER — SODIUM CHLORIDE 0.9 % IV SOLN: 500.0000 mL | INTRAVENOUS | Status: DC

## 2020-03-25 MED ORDER — OMEPRAZOLE 40 MG PO CPDR: 40.0000 mg | DELAYED_RELEASE_CAPSULE | Freq: Every day | ORAL | 3 refills | Status: DC

## 2020-03-25 MED ORDER — SUCRALFATE 1 G PO TABS: 1.0000 g | ORAL_TABLET | Freq: Four times a day (QID) | ORAL | 1 refills | Status: DC

## 2020-03-25 NOTE — Op Note (Signed)
[facility] Patient Name: Jacob FormRobert Bradley Procedure Date: 03/25/2020 2:36 PM MRN: [id number] Endoscopist: Doristine LocksVito Anayi Bricco , MD Age: [age] Referring MD:  Date of Birth: [birth year] Gender: Male Account #: [id number] Procedure:                Upper GI endoscopy Indications:              Generalized abdominal pain, Abdominal bloating,                            Eructation, Nausea with vomiting, Weight loss,                            Constipation Medicines:                Monitored Anesthesia Care Procedure:                Pre-Anesthesia Assessment:                           - Prior to the procedure, a History and Physical                            was performed, and patient medications and                            allergies were reviewed. The patient's tolerance of                            previous anesthesia was also reviewed. The risks                            and benefits of the procedure and the sedation                            options and risks were discussed with the patient.                            All questions were answered, and informed consent                            was obtained. Prior Anticoagulants: The patient has                            taken no previous anticoagulant or antiplatelet                            agents. ASA Grade Assessment: II - A patient with                            mild systemic disease. After reviewing the risks                            and benefits, the patient was deemed in  satisfactory condition to undergo the procedure.                           After obtaining informed consent, the endoscope was                            passed under direct vision. Throughout the                            procedure, the patient's blood pressure, pulse, and                            oxygen saturations were monitored continuously. The                            Endoscope was introduced through the mouth, and                             advanced to the second part of duodenum. The upper                            GI endoscopy was accomplished without difficulty.                            The patient tolerated the procedure well. Scope In: Scope Out: Findings:                 The upper third of the esophagus and middle third                            of the esophagus were normal.                           The Z-line was regular and was found 40 cm from the                            incisors. There were several mucosal breaks noted                            immediately distal to the Z line. There was mild                            mucosal oozing at these sites. This was present on                            endoscope insertion. No significant hernia                            appreciated on anterograde or retroflexed views.                            The area was irrigated. At the conclusion of the  study, there was no further bleeding from these                            sites. No endocopic intervention was needed.                           The gastroesophageal flap valve was visualized                            endoscopically and classified as Hill Grade II                            (fold present, opens with respiration).                           Scattered mild inflammation characterized by                            erythema was found in the gastric body and in the                            gastric antrum. Biopsies were taken with a cold                            forceps for Helicobacter pylori testing. Estimated                            blood loss was minimal.                           The examined duodenum was normal. Complications:            No immediate complications. Estimated Blood Loss:     Estimated blood loss was minimal. Impression:               - Normal upper third of esophagus and middle third                            of esophagus.                            - Z-line regular, 40 cm from the incisors.                           - There were several mucosal breaks noted                            immediately distal to the Z line. There was mild                            mucosal oozing at these sites. This was present on                            endoscope insertion. No significant hernia  appreciated on anterograde or retroflexed views.                            The area was irrigated. At the conclusion of the                            study, there was no further bleeding from these                            sites. Location, appearance, and clinical history                            suspicious for Mallory-Weiss tears.                           - Gastroesophageal flap valve classified as Hill                            Grade II (fold present, opens with respiration).                           - Gastritis. Biopsied.                           - Normal examined duodenum. Recommendation:           - Patient has a contact number available for                            emergencies. The signs and symptoms of potential                            delayed complications were discussed with the                            patient. Return to normal activities tomorrow.                            Written discharge instructions were provided to the                            patient.                           - Resume previous diet.                           - Continue present medications.                           - Await pathology results.                           - Use Prilosec (omeprazole) 40 mg PO BID for 6  weeks more, then reduce to 40 mg daily.                           - Use sucralfate tablets 1 gram PO QID for 4 weeks                            to promote mucosal healing. Break tablets in half,                            allow to dissolve in water and drink as a slurry.                            - If not already done, stop all marijuana for                            suspicion of Cannibinoid Hyperemesis Syndrome.                           - Colonoscopy today for evaluation of lower GI                            symptoms. Doristine Locks, MD 03/25/2020 3:21:38 PM

## 2020-03-25 NOTE — Progress Notes (Unsigned)
1541- pt c/o left lower abdominal pain, tearful, unable to rate.  He states, "I have this severe pain every time I go the bathroom- this is the second time today."  Denies that this is new pain for him- abdomen soft and pt has been passing air.  Pt vomited scant amount of clear saliva.  I asked pt what he normally does and he states, "I smoke weed or take a shower." Dr. Barron Alvine is aware.  Told to give Levsin 0.125 mg SL 2 tablets given per DO  Pt off monitor now.  Ambulated to bathroom and sat on toilet.  Pt has vomited several times but stated this is not new for him and he feel better after vomiting.  1625- pt sat in chair for a few minutes.  He states, "I just need to get a hot shower at home.  I am feeling better."  Dr. Barron Alvine is aware of this and ok to DC.  Taken to car via W/C

## 2020-03-25 NOTE — Progress Notes (Signed)
Called to room to assist during endoscopic procedure.  Patient ID and intended procedure confirmed with present staff. Received instructions for my participation in the procedure from the performing physician.  

## 2020-03-25 NOTE — Op Note (Signed)
[facility] Patient Name: Jacob Bradley Procedure Date: 03/25/2020 2:36 PM MRN: [id number] Endoscopist: Doristine Locks , MD Age: 25 Referring MD:  Date of Birth: 1996-01-03 Gender: Male Account #: [id number] Procedure:                Colonoscopy Indications:              Generalized abdominal pain/Left sided abdominal                            pain, Hematochezia, Constipation, Weight loss                           Evaluation to date has been unrevealing, to include                            4 CT scans and 2 ultrasounds performed since March                            2020, and recent CBC, CMP, lipase all unremarkable                            except for a very mildly elevated total bilirubin,                            consistent with benign Gilbert's Syndrome. Medicines:                Monitored Anesthesia Care Procedure:                Pre-Anesthesia Assessment:                           - Prior to the procedure, a History and Physical                            was performed, and patient medications and                            allergies were reviewed. The patient's tolerance of                            previous anesthesia was also reviewed. The risks                            and benefits of the procedure and the sedation                            options and risks were discussed with the patient.                            All questions were answered, and informed consent                            was obtained. Prior Anticoagulants: The patient has  taken no previous anticoagulant or antiplatelet                            agents. ASA Grade Assessment: II - A patient with                            mild systemic disease. After reviewing the risks                            and benefits, the patient was deemed in                            satisfactory condition to undergo the procedure.                           After obtaining  informed consent, the colonoscope                            was passed under direct vision. Throughout the                            procedure, the patient's blood pressure, pulse, and                            oxygen saturations were monitored continuously. The                            Olympus CF-HQ190L [id number]) Colonoscope was                            introduced through the anus and advanced to the the                            terminal ileum. The colonoscopy was performed                            without difficulty. The patient tolerated the                            procedure well. The quality of the bowel                            preparation was good. The terminal ileum, ileocecal                            valve, appendiceal orifice, and rectum were                            photographed. Scope In: 3:00:11 PM Scope Out: 3:10:00 PM Scope Withdrawal Time: 0 hours 7 minutes 3 seconds  Total Procedure Duration: 0 hours 9 minutes 49 seconds  Findings:                 Hemorrhoids were found on perianal exam.  Multiple small and large-mouthed diverticula were                            found in the sigmoid colon, descending colon,                            ascending colon and cecum.                           Non-bleeding internal hemorrhoids were found during                            retroflexion. The hemorrhoids were small and Grade                            II (internal hemorrhoids that prolapse but reduce                            spontaneously).                           Normal mucosa was otherwise found in the entire                            colon.                           The terminal ileum appeared normal. Complications:            No immediate complications. Estimated Blood Loss:     Estimated blood loss was minimal. Impression:               - Hemorrhoids found on perianal exam.                           - Diverticulosis in the  sigmoid colon, in the                            descending colon, in the ascending colon and in the                            cecum.                           - Non-bleeding internal hemorrhoids.                           - Normal mucosa in the entire examined colon.                           - The examined portion of the ileum was normal.                           - No specimens collected. Recommendation:           - Patient has a contact number available for  emergencies. The signs and symptoms of potential                            delayed complications were discussed with the                            patient. Return to normal activities tomorrow.                            Written discharge instructions were provided to the                            patient.                           - Resume previous diet.                           - Continue present medications.                           - Await pathology results.                           - Repeat colonoscopy at age 52 for screening                            purposes.                           - Follow-up with Doug Sou, PA-C in the GI                            clinic at appointment to be scheduled.                           - Use fiber, for example Citrucel, Fibercon, Konsyl                            or Metamucil.                           - Internal hemorrhoids were noted on this study and                            may be amenable to hemorrhoid band ligation. Doristine Locks, MD 03/25/2020 3:27:38 PM

## 2020-03-25 NOTE — Progress Notes (Signed)
Vs SM Pt's states no medical or surgical changes since previsit or office visit. 

## 2020-03-25 NOTE — Patient Instructions (Signed)
Await pathology  Please read over handouts about hemorrhoids, diverticulosis, hemorrhoidal banding, and gastritis  Pick up prescriptions for Prilosec- take 1 tablet twice daily for 6 weeks more, then reduce to 40 mg daily and Sucralfate 1 gram four times a day for 4 weeks- break tablet in half, allow to dissolve in water and drink as a slurry  Stop all marijuana!!  Use fiber over the counter like Citrucel, Fibercon, Konsyl or Metamucil      YOU HAD AN ENDOSCOPIC PROCEDURE TODAY AT THE [facility]:   Refer to the procedure report that was given to you for any specific questions about what was found during the examination.  If the procedure report does not answer your questions, please call your gastroenterologist to clarify.  If you requested that your care partner not be given the details of your procedure findings, then the procedure report has been included in a sealed envelope for you to review at your convenience later.  YOU SHOULD EXPECT: Some feelings of bloating in the abdomen. Passage of more gas than usual.  Walking can help get rid of the air that was put into your GI tract during the procedure and reduce the bloating. If you had a lower endoscopy (such as a colonoscopy or flexible sigmoidoscopy) you may notice spotting of blood in your stool or on the toilet paper. If you underwent a bowel prep for your procedure, you may not have a normal bowel movement for a few days.  Please Note:  You might notice some irritation and congestion in your nose or some drainage.  This is from the oxygen used during your procedure.  There is no need for concern and it should clear up in a day or so.  SYMPTOMS TO REPORT IMMEDIATELY:   Following lower endoscopy (colonoscopy or flexible sigmoidoscopy):  Excessive amounts of blood in the stool  Significant tenderness or worsening of abdominal pains  Swelling of the abdomen that is new, acute  Fever of 100F or higher   Following upper  endoscopy (EGD)  Vomiting of blood or coffee ground material  New chest pain or pain under the shoulder blades  Painful or persistently difficult swallowing  New shortness of breath  Fever of 100F or higher  Black, tarry-looking stools  For urgent or emergent issues, a gastroenterologist can be reached at any hour by calling [phone]. Do not use MyChart messaging for urgent concerns.    DIET:  We do recommend a small meal at first, but then you may proceed to your regular diet.  Drink plenty of fluids but you should avoid alcoholic beverages for 24 hours.  ACTIVITY:  You should plan to take it easy for the rest of today and you should NOT DRIVE or use heavy machinery until tomorrow (because of the sedation medicines used during the test).    FOLLOW UP: Our staff will call the number listed on your records 48-72 hours following your procedure to check on you and address any questions or concerns that you may have regarding the information given to you following your procedure. If we do not reach you, we will leave a message.  We will attempt to reach you two times.  During this call, we will ask if you have developed any symptoms of COVID 19. If you develop any symptoms (ie: fever, flu-like symptoms, shortness of breath, cough etc.) before then, please call [phone].  If you test positive for Covid 19 in the 2 weeks post procedure, please  call and report this information to [place].    If any biopsies were taken you will be contacted by phone or by letter within the next 1-3 weeks.  Please call us at [phone] if you have not heard about the biopsies in 3 weeks.    SIGNATURES/CONFIDENTIALITY: You and/or your care partner have signed paperwork which will be entered into your electronic medical record.  These signatures attest to the fact that that the information above on your After Visit Summary has been reviewed and is understood.  Full responsibility of the confidentiality of this  discharge information lies with you and/or your care-partner.

## 2020-03-25 NOTE — Progress Notes (Signed)
Report to PACU, RN, vss, BBS= Clear.  

## 2020-03-27 ENCOUNTER — Telehealth: Payer: Self-pay

## 2020-03-27 ENCOUNTER — Telehealth: Payer: Self-pay | Admitting: *Deleted

## 2020-03-27 NOTE — Telephone Encounter (Signed)
First post procedure follow up call, no answer 

## 2020-03-27 NOTE — Telephone Encounter (Signed)
  Follow up Call-  Call back number 03/25/2020  Post procedure Call Back phone  # [phone]  Permission to leave phone message Yes  Some recent data might be hidden     Patient questions:  Do you have a fever, pain , or abdominal swelling? No. Pain Score  0 *  Have you tolerated food without any problems? Yes.    Have you been able to return to your normal activities? Yes.    Do you have any questions about your discharge instructions: Diet   No. Medications  No. Follow up visit  No.  Do you have questions or concerns about your Care? No.  Actions: * If pain score is 4 or above: No action needed, pain <4.  1. Have you developed a fever since your procedure? no  2.   Have you had an respiratory symptoms (SOB or cough) since your procedure? no  3.   Have you tested positive for COVID 19 since your procedure no  4.   Have you had any family members/close contacts diagnosed with the COVID 19 since your procedure?  no   If yes to any of these questions please route to Laverna Peace, RN and Karlton Lemon, RN'

## 2020-04-10 ENCOUNTER — Telehealth: Payer: Self-pay | Admitting: Gastroenterology

## 2020-04-10 NOTE — Telephone Encounter (Signed)
Spoke to patient to inform him or recent biopsy results as he is unable to log into MyChart. He will pick up his medication for GERD and fiber supplement. Patient will contact the office if he decides to peruse hemorrhoid banding.

## 2020-04-10 NOTE — Telephone Encounter (Signed)
Pt's wife Jacob Bradley called inquiring about path results. She stated that pt does not remember her password for My chart so is not able to access. Pls call Jacob Bradley.

## 2020-04-15 NOTE — Progress Notes (Deleted)
Referring Provider: Donetta Potts, MD Primary Care Physician:  Donetta Potts, MD Primary GI Physician: Dr. Jena Gauss, but recently established with Dr. Bethann Berkshire.   No chief complaint on file.   HPI:   Jacob Bradley is a 25 y.o. male presenting today with a history of heartburn, chronic abdominal pain, nausea, vomiting, diarrhea, constipation, rectal bleeding, alcohol use, and chronic marijuana use.  Previously reported improvement in heartburn and vomiting after discontinuing alcohol.  Patient was initially seen in our office 11/06/2018 with plans for EGD and colonoscopy, start Protonix twice daily, try Bentyl 10 mg 4 times daily for abdominal cramps and loose stools, and labs to evaluate for celiac disease.  Labs not completed.  Patient called and canceled his appointments due to cost.   Patient no showed to 2 follow-up appointments and later establish with [facility] on 03/12/2020 and was scheduled for EGD and colonoscopy which were completed 03/25/2020.  EGD with several mucosal breaks immediately distal to the Z-line, mild mucosal oozing at the sites, suspicious for Mallory-Weiss tears.  Gastritis s/p biopsy (mild chronic gastritis, negative H. Pylori), normal examined duodenum.  Recommended Prilosec 40 mg twice daily x6 weeks more than reduced to 40 mg daily.  Carafate 1 g 4 times daily x4 weeks, stop marijuana. Colonoscopy with internal hemorrhoids, small largemouth diverticula in the sigmoid, descending, ascending, colon and cecum, otherwise normal-appearing colonic mucosa and normal TI.  Recommended repeat colonoscopy age 25 and follow-up with [facility].  Start fiber supplement and consider hemorrhoid banding.  Today:   Past Medical History:  Diagnosis Date  . Bipolar disorder [facility])     Past Surgical History:  Procedure Laterality Date  . none      Current Outpatient Medications  Medication Sig Dispense Refill  . hyoscyamine (LEVSIN SL) 0.125 MG SL tablet Take by mouth  every 4 (four) hours as needed. (Patient not taking: Reported on 03/25/2020)    . omeprazole (PRILOSEC) 40 MG capsule Take 1 capsule (40 mg total) by mouth daily. Take BID for 6 weeks more, then reduce to 40 mg daily 60 capsule 3  . pantoprazole (PROTONIX) 40 MG tablet Take 1 tablet (40 mg total) by mouth 2 (two) times daily before a meal. (Patient not taking: Reported on 03/25/2020) 60 tablet 3  . PEG-KCl-NaCl-NaSulf-Na Asc-C (PLENVU) 140 g SOLR Use as directed 1 each 0  . sucralfate (CARAFATE) 1 g tablet Take 1 tablet (1 g total) by mouth 4 (four) times daily. Break tablet in half, allow to dissolve in water and drink in a slurry- 4 times a day for 4 weeks 120 tablet 1   No current facility-administered medications for this visit.    Allergies as of 04/16/2020  . (No Known Allergies)    Family History  Problem Relation Age of Onset  . Cirrhosis Mother        etoh  . Colon cancer Neg Hx   . Inflammatory bowel disease Neg Hx   . Celiac disease Neg Hx     Social History   Socioeconomic History  . Marital status: Married    Spouse name: Not on file  . Number of children: Not on file  . Years of education: Not on file  . Highest education level: Not on file  Occupational History  . Not on file  Tobacco Use  . Smoking status: Current Every Day Smoker    Packs/day: 1.00    Years: 8.00    Pack years: 8.00  Types: Cigarettes  . Smokeless tobacco: Never Used  Vaping Use  . Vaping Use: Former  Substance and Sexual Activity  . Alcohol use: Yes    Comment: Rarely, never heavy  . Drug use: Yes    Types: Marijuana    Comment: last use of weed was today 03/25/2020  . Sexual activity: Not on file  Other Topics Concern  . Not on file  Social History Narrative  . Not on file   Social Determinants of Health   Financial Resource Strain: Not on file  Food Insecurity: Not on file  Transportation Needs: Not on file  Physical Activity: Not on file  Stress: Not on file  Social  Connections: Not on file    Review of Systems: Gen: Denies fever, chills, anorexia. Denies fatigue, weakness, weight loss.  CV: Denies chest pain, palpitations, syncope, peripheral edema, and claudication. Resp: Denies dyspnea at rest, cough, wheezing, coughing up blood, and pleurisy. GI: Denies vomiting blood, jaundice, and fecal incontinence.   Denies dysphagia or odynophagia. Derm: Denies rash, itching, dry skin Psych: Denies depression, anxiety, memory loss, confusion. No homicidal or suicidal ideation.  Heme: Denies bruising, bleeding, and enlarged lymph nodes.  Physical Exam: There were no vitals taken for this visit. General:   Alert and oriented. No distress noted. Pleasant and cooperative.  Head:  Normocephalic and atraumatic. Eyes:  Conjuctiva clear without scleral icterus. Mouth:  Oral mucosa pink and moist. Good dentition. No lesions. Heart:  S1, S2 present without murmurs appreciated. Lungs:  Clear to auscultation bilaterally. No wheezes, rales, or rhonchi. No distress.  Abdomen:  +BS, soft, non-tender and non-distended. No rebound or guarding. No HSM or masses noted. Msk:  Symmetrical without gross deformities. Normal posture. Extremities:  Without edema. Neurologic:  Alert and  oriented x4 Psych:  Alert and cooperative. Normal mood and affect.

## 2020-04-16 ENCOUNTER — Ambulatory Visit: Payer: Self-pay | Admitting: Gastroenterology

## 2020-05-15 DIAGNOSIS — F17210 Nicotine dependence, cigarettes, uncomplicated: Secondary | ICD-10-CM | POA: Diagnosis not present

## 2020-05-15 DIAGNOSIS — R918 Other nonspecific abnormal finding of lung field: Secondary | ICD-10-CM | POA: Diagnosis not present

## 2020-05-15 DIAGNOSIS — R064 Hyperventilation: Secondary | ICD-10-CM | POA: Diagnosis not present

## 2020-05-15 DIAGNOSIS — K3189 Other diseases of stomach and duodenum: Secondary | ICD-10-CM | POA: Diagnosis not present

## 2020-05-15 DIAGNOSIS — K6389 Other specified diseases of intestine: Secondary | ICD-10-CM | POA: Diagnosis not present

## 2020-05-15 DIAGNOSIS — R231 Pallor: Secondary | ICD-10-CM | POA: Diagnosis not present

## 2020-05-15 DIAGNOSIS — R0689 Other abnormalities of breathing: Secondary | ICD-10-CM | POA: Diagnosis not present

## 2020-05-15 DIAGNOSIS — R1013 Epigastric pain: Secondary | ICD-10-CM | POA: Diagnosis not present

## 2020-05-15 DIAGNOSIS — R202 Paresthesia of skin: Secondary | ICD-10-CM | POA: Diagnosis not present

## 2020-05-22 DIAGNOSIS — R109 Unspecified abdominal pain: Secondary | ICD-10-CM | POA: Diagnosis not present

## 2020-05-22 DIAGNOSIS — Z6825 Body mass index (BMI) 25.0-25.9, adult: Secondary | ICD-10-CM | POA: Diagnosis not present

## 2020-05-22 DIAGNOSIS — R21 Rash and other nonspecific skin eruption: Secondary | ICD-10-CM | POA: Diagnosis not present

## 2020-05-22 DIAGNOSIS — F17200 Nicotine dependence, unspecified, uncomplicated: Secondary | ICD-10-CM | POA: Diagnosis not present

## 2020-06-02 DIAGNOSIS — Z6825 Body mass index (BMI) 25.0-25.9, adult: Secondary | ICD-10-CM | POA: Diagnosis not present

## 2020-06-02 DIAGNOSIS — R1012 Left upper quadrant pain: Secondary | ICD-10-CM | POA: Diagnosis not present

## 2020-06-04 DIAGNOSIS — R1012 Left upper quadrant pain: Secondary | ICD-10-CM | POA: Diagnosis not present

## 2020-06-11 ENCOUNTER — Telehealth: Payer: Self-pay | Admitting: Gastroenterology

## 2020-06-11 NOTE — Telephone Encounter (Signed)
Spoke with patient, he states that he was seen at [facility] surgery for evaluation of his gallbladder and everything came back normal and he was told to follow up with [place]. He reports left sided pain under his ribs. He describes the pain as cramping and stabbing, he feels the pain sometimes after he eats and when he is having a bowel movement. Patient states that his bowels can fluctuate between solid stools and soft stools. For the past 2 weeks he has had hard stools, he does not feel like he is emptying completely and states that he is on the commode for a long time. Denies any laxatives or stools softeners at this time. Patient states that he is taking Omeprazole, Pantoprazole, sucralfate QID for the past 2 months, Pepcid PRN for heartburn, Levsin PRN, and Cannabis PRN. He reports that he vomits just about everyday, usually in the mornings. Patient states that he took the PPI's about 30 minutes before his evening meal yesterday and vomited the entire pills and food. He reports that he has lost about 5-10 lbs over a week or 2. He states that he is only eating about once a day. Patient is scheduled for a follow up with Doug Sou, PA on Wednesday, 07/01/20 at 2:30 PM. Please advise, thanks.

## 2020-06-11 NOTE — Telephone Encounter (Signed)
Spoke with patient in regards to recommendations. Patient verbalized understanding and had no concerns at the end of the call.  

## 2020-06-11 NOTE — Telephone Encounter (Signed)
Recommend continuing medications as prescribed for now and strongly recommend discontinuing any marijuana/cannabis for high suspicion of cannabinoid hyperemesis syndrome, which may be superimposed on pre-existing IBS.  Continue p.o. intake as tolerated and recommend 6 small meals per day, with 2 of the meals being largely liquid based (i.e. Ensure, boost, etc.) to maintain caloric intake.  To keep follow-up appointment with Jacob Bradley as scheduled.

## 2020-06-11 NOTE — Telephone Encounter (Signed)
Pt states that he has been experiencing nausea every morning, he reports to experience a lot of pain with bms. He would like to be seen asap. Pls call him.

## 2020-07-01 ENCOUNTER — Ambulatory Visit: Payer: BC Managed Care – PPO | Admitting: Gastroenterology

## 2020-07-15 ENCOUNTER — Encounter: Payer: Self-pay | Admitting: Gastroenterology

## 2020-07-15 ENCOUNTER — Ambulatory Visit (INDEPENDENT_AMBULATORY_CARE_PROVIDER_SITE_OTHER): Payer: BC Managed Care – PPO | Admitting: Gastroenterology

## 2020-07-15 VITALS — BP 130/88 | HR 78 | Ht 65.0 in | Wt 156.2 lb

## 2020-07-15 DIAGNOSIS — R1013 Epigastric pain: Secondary | ICD-10-CM | POA: Diagnosis not present

## 2020-07-15 DIAGNOSIS — R112 Nausea with vomiting, unspecified: Secondary | ICD-10-CM

## 2020-07-15 NOTE — Progress Notes (Signed)
07/15/2020 Jacob Bradley [id number] [birth year]   HISTORY OF PRESENT ILLNESS:  This is a 25 year old male with ongoing complaints of nausea, vomiting, and upper abdominal pain.  He has undergone extensive evaluation including EGD, colonoscopy, multiple CT scans, 2 abdominal ultrasounds, HIDA scan since 2020, none of which have shown any cause of symptoms.  He smokes marijuana regularly and says that he has been smoking marijuana since he was 25 years old.  He was told to discontinue the marijuana use and he stopped it for short time, but says that he could not function because of his pain and nausea.  He states that he also smokes it to help with his anxiety.  His PCP requested that he see a psychiatrist, but he has not yet done so.  He says that smoking marijuana tends to relax him and help with his symptoms.  Also taking a hot shower helps with the symptoms.  He is currently on daily PPI and Carafate tablets, but continues with symptoms.  He continues to be concerned about a gallbladder issue despite negative studies.  He reports that he has had episodes where he vomits in the morning and brings up undigested food and even sometimes his PPI that he takes in the evening.  He does admit that he tends to eat late at night, but then is usually still up several hours before going to sleep.   Past Medical History:  Diagnosis Date  . Bipolar disorder [facility])    Past Surgical History:  Procedure Laterality Date  . COLONOSCOPY    . none    . UPPER GASTROINTESTINAL ENDOSCOPY      reports that he has been smoking cigarettes. He has a 8.00 pack-year smoking history. He has never used smokeless tobacco. He reports current alcohol use. He reports current drug use. Drug: Marijuana. family history includes Cirrhosis in his mother. No Known Allergies    Outpatient Encounter Medications as of 07/15/2020  Medication Sig  . omeprazole (PRILOSEC) 40 MG capsule Take 40 mg by mouth daily.  . sucralfate  (CARAFATE) 1 g tablet Take 1 tablet (1 g total) by mouth 4 (four) times daily. Break tablet in half, allow to dissolve in water and drink in a slurry- 4 times a day for 4 weeks  . [DISCONTINUED] hyoscyamine (LEVSIN SL) 0.125 MG SL tablet Take by mouth every 4 (four) hours as needed. (Patient not taking: No sig reported)  . [DISCONTINUED] omeprazole (PRILOSEC) 40 MG capsule Take 1 capsule (40 mg total) by mouth daily. Take BID for 6 weeks more, then reduce to 40 mg daily (Patient not taking: Reported on 07/15/2020)  . [DISCONTINUED] pantoprazole (PROTONIX) 40 MG tablet Take 1 tablet (40 mg total) by mouth 2 (two) times daily before a meal. (Patient not taking: No sig reported)  . [DISCONTINUED] PEG-KCl-NaCl-NaSulf-Na Asc-C (PLENVU) 140 g SOLR Use as directed (Patient not taking: Reported on 07/15/2020)   No facility-administered encounter medications on file as of 07/15/2020.     REVIEW OF SYSTEMS  : All other systems reviewed and negative except where noted in the History of Present Illness.   PHYSICAL EXAM: BP 130/88 (BP Location: Left Arm, Patient Position: Sitting, Cuff Size: Normal)   Pulse 78   Ht 5\' 5"  (1.651 m)   Wt 156 lb 4 oz (70.9 kg)   SpO2 96%   BMI 26.00 kg/m  General: Well developed white male in no acute distress Head: Normocephalic and atraumatic Eyes:  Sclerae anicteric, conjunctiva pink.  Ears: Normal auditory acuity Lungs: Clear throughout to auscultation; no W/R/R. Heart: Regular rate and rhythm; no M/R/G. Abdomen: Soft, non-distended.  BS present.  Upper abdominal TTP. Musculoskeletal: Symmetrical with no gross deformities  Skin: No lesions on visible extremities Extremities: No edema  Neurological: Alert oriented x 4, grossly non-focal Psychological:  Alert and cooperative. Normal mood and affect  ASSESSMENT AND PLAN: *25 year old male with ongoing complaints of nausea, vomiting, and upper abdominal pain.  He has undergone extensive evaluation including EGD,  colonoscopy, multiple CT scans, 2 abdominal ultrasounds, HIDA scan since 2020.  He reports vomiting undigested food in pills in the morning that he consumes/takes the night before.  We will perform gastric emptying scan to rule out gastroparesis.  His symptoms are likely hyperemesis related to cannabis use, but he states that he stopped smoking for short time and could not function.  Likely also has IBS.  Reports that he also smokes to help reduce anxiety.  He is supposed to be seeing a psychiatrist to try to help manage that and they will continue to work on being seen somewhere.  Otherwise we can treat acid reflux, give antiemetics and possibly antispasmodics for symptom management.   CC:  Donetta Potts, MD

## 2020-07-15 NOTE — Patient Instructions (Signed)
If you are age 25 or older, your body mass index should be between 23-30. Your Body mass index is 26 kg/m. If this is out of the aforementioned range listed, please consider follow up with your Primary Care Provider.  If you are age 37 or younger, your body mass index should be between 19-25. Your Body mass index is 26 kg/m. If this is out of the aformentioned range listed, please consider follow up with your Primary Care Provider.   IMAGING:  . You will be contacted by [facility] Scheduling (Your caller ID will indicate phone # [phone]) within the next business 7-10 business days to schedule your gastric emptying scan. If you have not heard from them within 7-10 business days, please call [facility] Scheduling at [phone] to follow up on the status of your appointment.

## 2020-07-17 NOTE — Progress Notes (Signed)
Agree with the assessment and plan as outlined by Jessica Zehr, PA-C. ? ?Daniel Ritthaler, DO, FACG ? ?

## 2020-07-31 ENCOUNTER — Ambulatory Visit (HOSPITAL_COMMUNITY)
Admission: RE | Admit: 2020-07-31 | Discharge: 2020-07-31 | Disposition: A | Discharge status: Home / Self Care | Payer: BC Managed Care – PPO | Source: Ambulatory Visit | Attending: Gastroenterology | Admitting: Gastroenterology

## 2020-07-31 ENCOUNTER — Other Ambulatory Visit: Payer: Self-pay

## 2020-07-31 DIAGNOSIS — R112 Nausea with vomiting, unspecified: Secondary | ICD-10-CM | POA: Diagnosis not present

## 2020-07-31 MED ORDER — TECHNETIUM TC 99M SULFUR COLLOID
2.2000 | Freq: Once | INTRAVENOUS | Status: AC
  Administered 2020-07-31: 2.2 via ORAL

## 2020-09-28 DIAGNOSIS — F411 Generalized anxiety disorder: Secondary | ICD-10-CM | POA: Diagnosis not present

## 2020-10-05 DIAGNOSIS — F411 Generalized anxiety disorder: Secondary | ICD-10-CM | POA: Diagnosis not present

## 2020-12-02 DIAGNOSIS — F411 Generalized anxiety disorder: Secondary | ICD-10-CM | POA: Diagnosis not present

## 2020-12-02 DIAGNOSIS — F315 Bipolar disorder, current episode depressed, severe, with psychotic features: Secondary | ICD-10-CM | POA: Diagnosis not present

## 2021-01-09 DIAGNOSIS — F315 Bipolar disorder, current episode depressed, severe, with psychotic features: Secondary | ICD-10-CM | POA: Diagnosis not present

## 2021-01-09 DIAGNOSIS — F411 Generalized anxiety disorder: Secondary | ICD-10-CM | POA: Diagnosis not present

## 2021-01-12 DIAGNOSIS — R21 Rash and other nonspecific skin eruption: Secondary | ICD-10-CM | POA: Diagnosis not present

## 2021-01-12 DIAGNOSIS — Z6826 Body mass index (BMI) 26.0-26.9, adult: Secondary | ICD-10-CM | POA: Diagnosis not present

## 2021-01-12 DIAGNOSIS — R109 Unspecified abdominal pain: Secondary | ICD-10-CM | POA: Diagnosis not present

## 2021-01-12 DIAGNOSIS — F419 Anxiety disorder, unspecified: Secondary | ICD-10-CM | POA: Diagnosis not present

## 2021-04-24 IMAGING — CT CT ABD-PELV W/ CM
2 of 4 series · 16 of 46 positions shown, 18 images · IV contrast (APPLIED)
Comparison: 09/23/2018

CLINICAL DATA: Acute generalized abdominal pain, epigastric pain,
nausea/vomiting/diarrhea for 2 months, neutropenia

EXAM:
CT ABDOMEN AND PELVIS WITH CONTRAST
TECHNIQUE: Multidetector CT imaging of the abdomen and pelvis was performed
using the standard protocol following bolus administration of
intravenous contrast.
CONTRAST:  100mL OMNIPAQUE IOHEXOL 300 MG/ML  SOLN

[Series 3: abd/ pelvis 5.0 i30f 2 · axial · 0.77mm/px · z∈[-598,-213]mm · 13 of 85 slices shown, 15 images]
[im 4/85  soft-tissue]
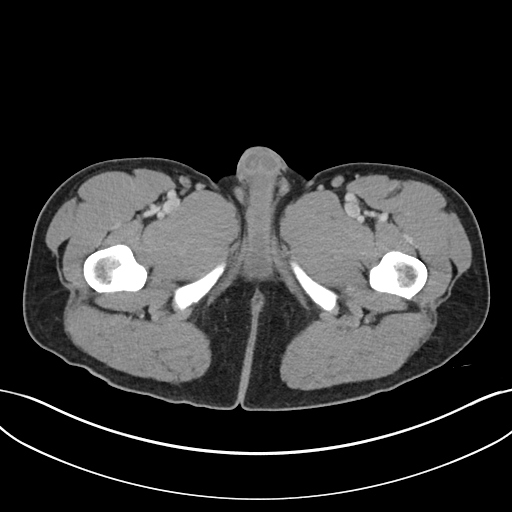
[im 4/85  bone]
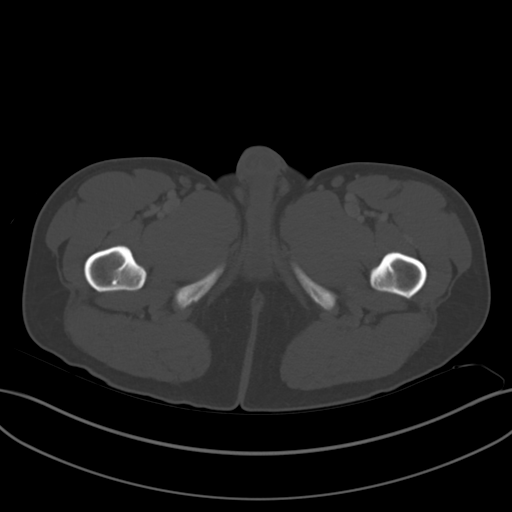
[im 12/85  soft-tissue]
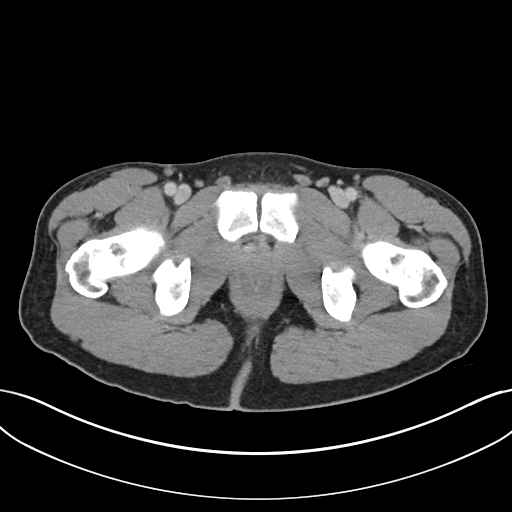
[im 20/85  soft-tissue]
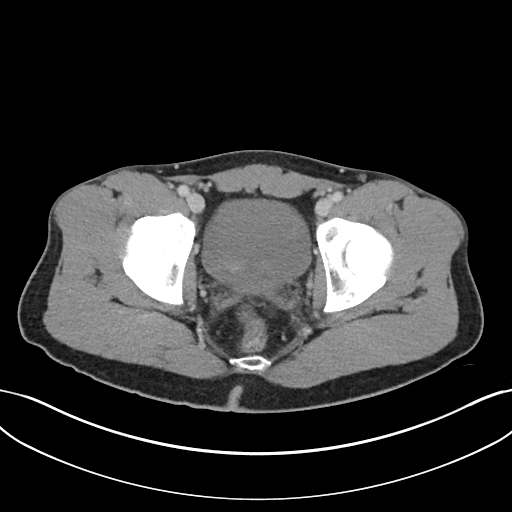
[im 23/85  soft-tissue]
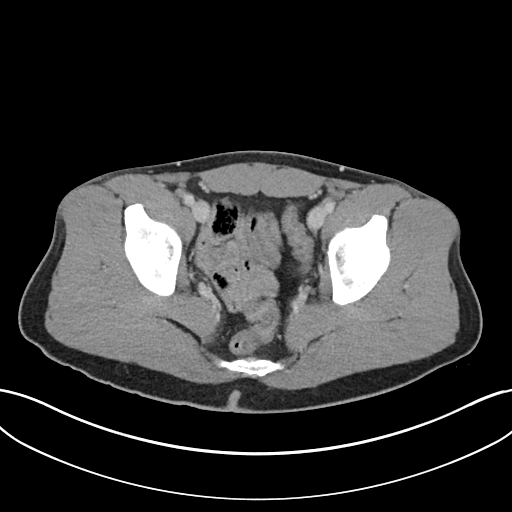
[im 31/85  soft-tissue]
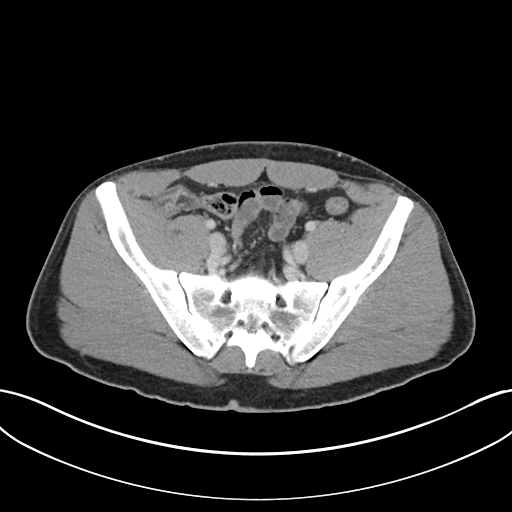
[im 35/85  soft-tissue]
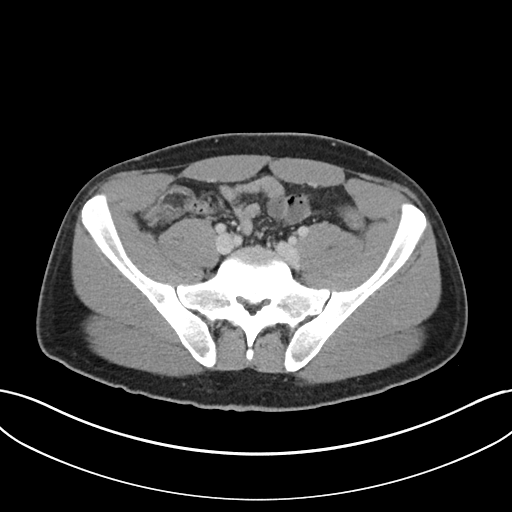
[im 43/85  soft-tissue]
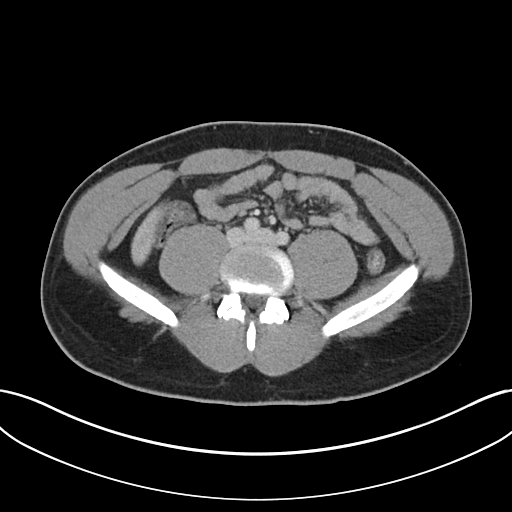
[im 50/85  soft-tissue]
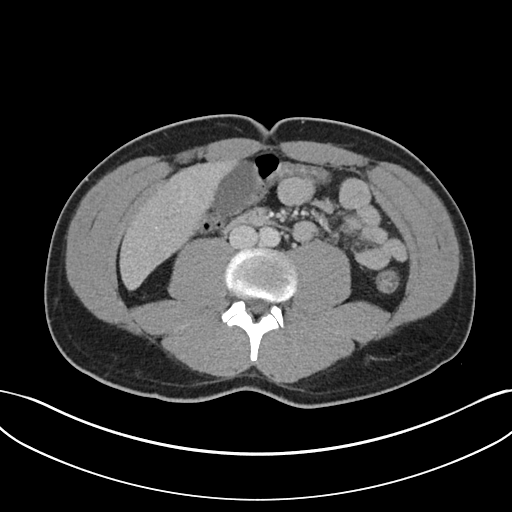
[im 54/85  soft-tissue]
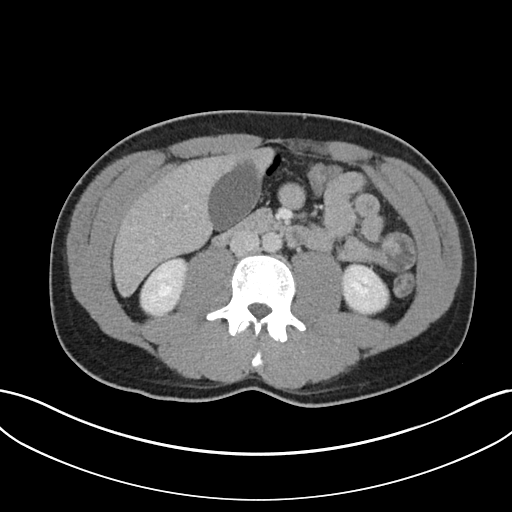
[im 54/85  bone]
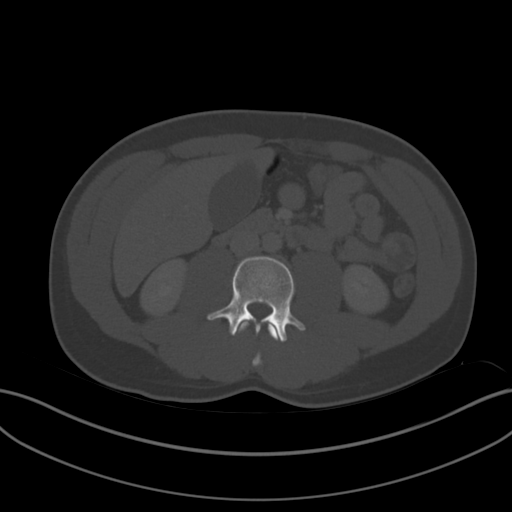
[im 62/85  soft-tissue]
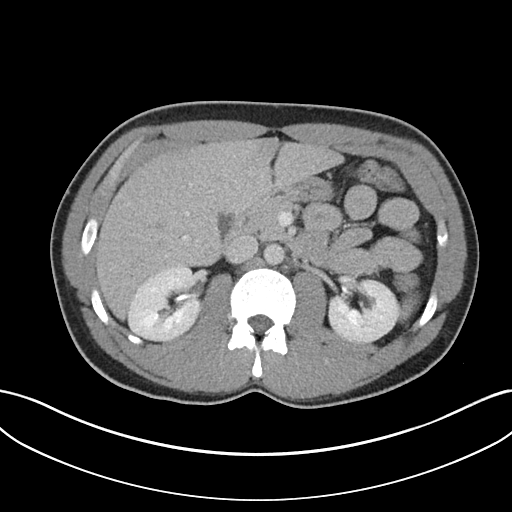
[im 65/85  soft-tissue]
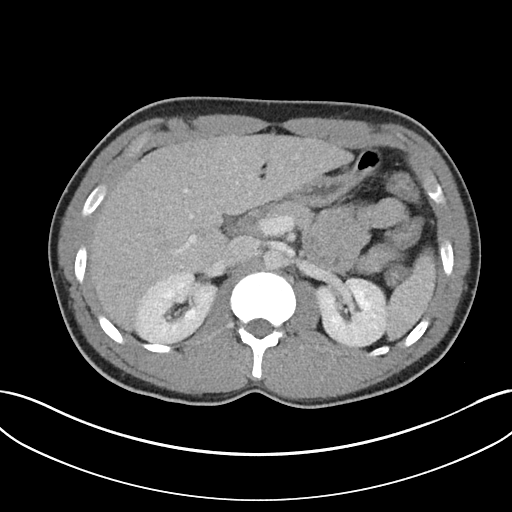
[im 73/85  soft-tissue]
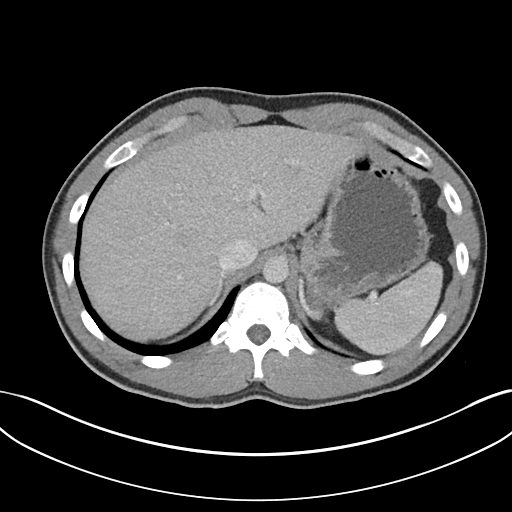
[im 81/85  soft-tissue]
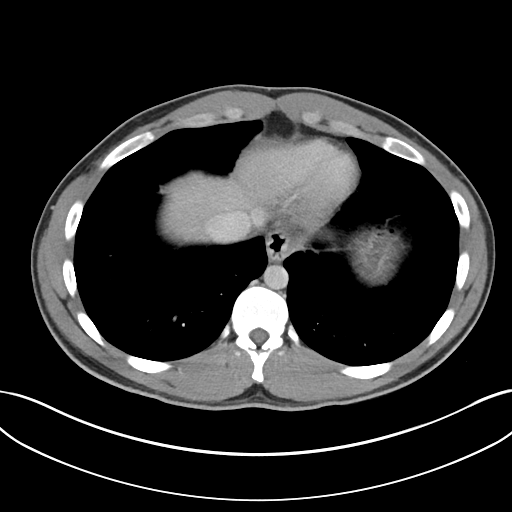

[Series 6: coronal soft tissue · coronal · 0.69mm/px · 3 of 85 slices shown]
[im 29/85  soft-tissue]
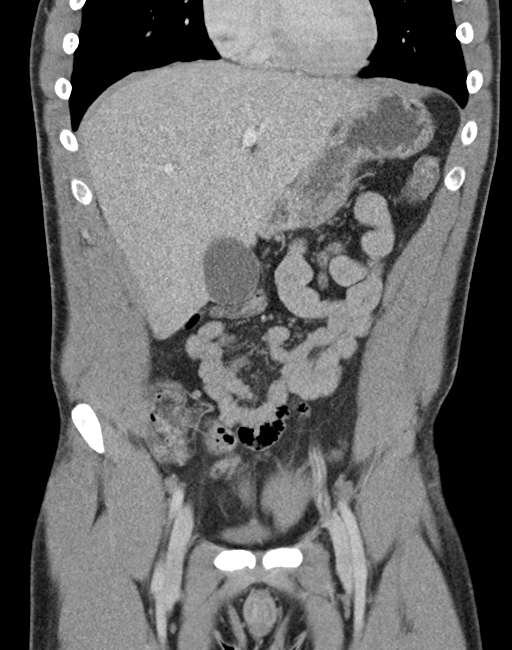
[im 38/85  soft-tissue]
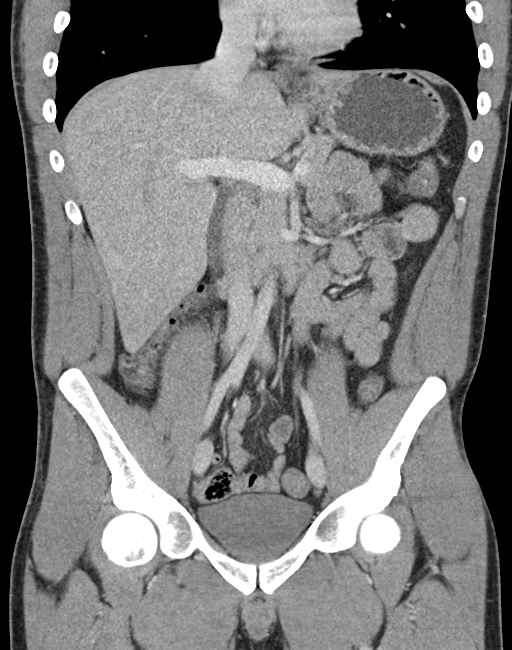
[im 47/85  soft-tissue]
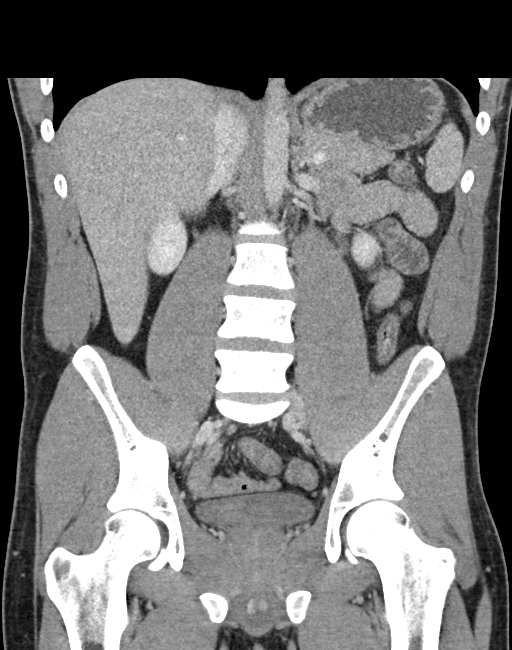

[16 of 46 positions shown; findings below may reference images not displayed]

FINDINGS: Lower chest: No acute pleural or parenchymal lung disease.

Hepatobiliary: No focal liver abnormality is seen. No gallstones,
gallbladder wall thickening, or biliary dilatation.

Pancreas: Unremarkable. No pancreatic ductal dilatation or
surrounding inflammatory changes.

Spleen: Normal in size without focal abnormality.

Adrenals/Urinary Tract: Adrenal glands are unremarkable. Kidneys are
normal, without renal calculi, focal lesion, or hydronephrosis.
Bladder is unremarkable.

Stomach/Bowel: No bowel obstruction or ileus. Normal appendix right
lower quadrant. No bowel wall thickening or inflammatory change.

Vascular/Lymphatic: No significant vascular findings are present. No
enlarged abdominal or pelvic lymph nodes.

Reproductive: Prostate is unremarkable.

Other: No abdominal wall hernia or abnormality. No abdominopelvic
ascites.

Musculoskeletal: No acute or destructive bony lesions. Reconstructed
images demonstrate no additional findings.
IMPRESSION: 1. No acute intra-abdominal or intrapelvic process.

## 2021-07-29 ENCOUNTER — Encounter: Payer: Self-pay | Admitting: Family Medicine

## 2021-07-29 ENCOUNTER — Ambulatory Visit (INDEPENDENT_AMBULATORY_CARE_PROVIDER_SITE_OTHER): Payer: BC Managed Care – PPO | Admitting: Family Medicine

## 2021-07-29 ENCOUNTER — Encounter: Payer: Self-pay | Admitting: Internal Medicine

## 2021-07-29 VITALS — BP 136/88 | HR 94 | Resp 18 | Ht 65.0 in | Wt 154.0 lb

## 2021-07-29 DIAGNOSIS — R21 Rash and other nonspecific skin eruption: Secondary | ICD-10-CM | POA: Insufficient documentation

## 2021-07-29 DIAGNOSIS — Z0001 Encounter for general adult medical examination with abnormal findings: Secondary | ICD-10-CM

## 2021-07-29 DIAGNOSIS — K580 Irritable bowel syndrome with diarrhea: Secondary | ICD-10-CM | POA: Diagnosis not present

## 2021-07-29 DIAGNOSIS — Z114 Encounter for screening for human immunodeficiency virus [HIV]: Secondary | ICD-10-CM

## 2021-07-29 DIAGNOSIS — Z1159 Encounter for screening for other viral diseases: Secondary | ICD-10-CM

## 2021-07-29 DIAGNOSIS — R1012 Left upper quadrant pain: Secondary | ICD-10-CM

## 2021-07-29 DIAGNOSIS — E559 Vitamin D deficiency, unspecified: Secondary | ICD-10-CM

## 2021-07-29 DIAGNOSIS — Z720 Tobacco use: Secondary | ICD-10-CM

## 2021-07-29 DIAGNOSIS — R7301 Impaired fasting glucose: Secondary | ICD-10-CM | POA: Diagnosis not present

## 2021-07-29 MED ORDER — LOPERAMIDE HCL 2 MG PO TABS: ORAL_TABLET | ORAL | 0 refills | Status: DC

## 2021-07-29 MED ORDER — DICYCLOMINE HCL 20 MG PO TABS: 20.0000 mg | ORAL_TABLET | Freq: Four times a day (QID) | ORAL | 2 refills | Status: DC

## 2021-07-29 NOTE — Assessment & Plan Note (Addendum)
-  Reports upper left abdominal pain and diarrhea -Rates pain 12/10 -Pain is described as squeezing, pulsating, and stabbing -Reports 4-5 loose stools in the morning -Encouraged to continue taking sucralfate and omeprazole -Will prescribe bentyl for abdominal pain and loperamide for diarrhea -Last GI follow-up was 09/14/20 -Will refer patient to GI for follow up

## 2021-07-29 NOTE — Assessment & Plan Note (Signed)
-  multiple 3-8 mm erythematous confluent round macules on right lower leg -noted to be itchy -encouraged to keep skin moisturized and continue applying the steroid cream

## 2021-07-29 NOTE — Progress Notes (Signed)
New Patient Office Visit  Subjective:  Patient ID: Jacob Bradley, male    DOB: 10/20/1995  Age: 27 y.o. MRN: [id number]  CC:  Chief Complaint  Patient presents with   New Patient (Initial Visit)    New patient has been having stomach problems for 1.5 years when he has bm there is diarrhea and bad pain also sometimes has blood has lost a lot of weight     HPI Jacob Bradley is a 26 y.o. male with past medical history of epigastric abdominal pain presents for establishing care. IBS: diagnosed at the age of 43. Reports upper left abdominal pain and diarrhea. Rates pain 12/10. Pain is described as squeezing, pulsating, and stabbing. Reports 4-5 loose stools in the morning. Symptoms subside after taking a shower. He reports taking his mom's Subutex 15 mg and smoking marijuana for symptom relief. He shares that he had stopped taking his sucralfate 3-4 weeks ago because he couldn't keep it down. He had an upper endoscopy on 03/25/20 with a stomach biopsy. The biopsies were notable for gastritis (inflammation), but there was no evidence of Helicobacter pylori infection. He reports being out of work due to chronic pain. Tobacco use: smokes 1ppd; used to smoke 2 ppd and has cut back. Rash: onset was a year ago. Rash is itchy and dry. Reports using otc steroid cream.      Past Medical History:  Diagnosis Date   Bipolar disorder ([place])     Past Surgical History:  Procedure Laterality Date   COLONOSCOPY     none     UPPER GASTROINTESTINAL ENDOSCOPY      Family History  Problem Relation Age of Onset   Cirrhosis Mother        etoh   Colon cancer Neg Hx    Inflammatory bowel disease Neg Hx    Celiac disease Neg Hx    Esophageal cancer Neg Hx    Pancreatic cancer Neg Hx    Stomach cancer Neg Hx     Social History   Socioeconomic History   Marital status: Married    Spouse name: Not on file   Number of children: Not on file   Years of education: Not on file   Highest education  level: Not on file  Occupational History   Not on file  Tobacco Use   Smoking status: Every Day    Packs/day: 1.00    Years: 8.00    Total pack years: 8.00    Types: Cigarettes   Smokeless tobacco: Never  Vaping Use   Vaping Use: Never used  Substance and Sexual Activity   Alcohol use: Yes    Comment: Rarely, never heavy   Drug use: Yes    Types: Marijuana   Sexual activity: Not on file  Other Topics Concern   Not on file  Social History Narrative   Not on file   Social Determinants of Health   Financial Resource Strain: Not on file  Food Insecurity: Not on file  Transportation Needs: Not on file  Physical Activity: Not on file  Stress: Not on file  Social Connections: Not on file  Intimate Partner Violence: Not on file    ROS Review of Systems  Constitutional:  Negative for chills, fatigue and fever.  HENT:  Negative for sinus pressure, sinus pain, sneezing, sore throat and tinnitus.   Eyes:  Negative for pain, itching and visual disturbance.  Respiratory:  Negative for chest tightness and shortness of breath.  Cardiovascular:  Negative for chest pain and palpitations.  Gastrointestinal:  Positive for abdominal pain, blood in stool (sometimes 2/5 times), diarrhea and nausea. Negative for constipation and rectal pain.  Endocrine: Negative for polydipsia, polyphagia and polyuria.  Genitourinary:  Negative for frequency and urgency.  Skin:  Positive for rash (right lower leg).  Neurological:  Negative for weakness, numbness and headaches.  Psychiatric/Behavioral:  Negative for confusion, self-injury and suicidal ideas.     Objective:   Today's Vitals: BP 136/88 (BP Location: Right Arm, Patient Position: Sitting, Cuff Size: Normal)   Pulse 94   Resp 18   Ht _0  (1.651 m)   Wt 154 lb (69.9 kg)   SpO2 98%   BMI 25.63 kg/m   Physical Exam HENT:     Head: Normocephalic.     Right Ear: External ear normal.     Left Ear: External ear normal.     Nose: No  congestion.     Mouth/Throat:     Mouth: Mucous membranes are moist.  Eyes:     Extraocular Movements: Extraocular movements intact.     Pupils: Pupils are equal, round, and reactive to light.  Cardiovascular:     Rate and Rhythm: Normal rate and regular rhythm.     Pulses: Normal pulses.     Heart sounds: Normal heart sounds.  Pulmonary:     Effort: Pulmonary effort is normal. No respiratory distress.  Abdominal:     General: Bowel sounds are normal.     Palpations: Abdomen is soft.     Tenderness: There is abdominal tenderness in the epigastric area and left upper quadrant. There is no right CVA tenderness or left CVA tenderness.  Musculoskeletal:     Cervical back: No rigidity.     Right lower leg: No edema.     Left lower leg: No edema.  Skin:    Findings: Rash (multiple 3-8 mm erythematous confluent round macules on right lower leg) present.  Neurological:     Mental Status: He is alert and oriented to person, place, and time.  Psychiatric:     Comments: Normal affect      Assessment & Plan:   Problem List Items Addressed This Visit       Musculoskeletal and Integument   Rash    -multiple 3-8 mm erythematous confluent round macules on right lower leg -noted to be itchy -encouraged to keep skin moisturized and continue applying the steroid cream          Other   Abdominal pain    -Reports upper left abdominal pain and diarrhea -Rates pain 12/10 -Pain is described as squeezing, pulsating, and stabbing -Reports 4-5 loose stools in the morning -Encouraged to continue taking sucralfate and omeprazole -Will prescribe bentyl for abdominal pain and loperamide for diarrhea -Last GI follow-up was 09/14/20 -Will refer patient to GI for follow up      Tobacco use    Smokes about 1 pack/day  Asked about quitting: confirms that he currently smokes cigarettes Advise to quit smoking: Educated about QUITTING to reduce the risk of cancer, cardio and cerebrovascular  disease. Assess willingness: Unwilling to quit at this time, but is working on cutting back. Assist with counseling and pharmacotherapy: Counseled for 5 minutes and literature provided. Arrange for follow up: follow up in 3 months and continue to offer help.      Other Visit Diagnoses     Irritable bowel syndrome with diarrhea    -  Primary  Relevant Medications   dicyclomine (BENTYL) 20 MG tablet   ondansetron (ZOFRAN) 4 MG tablet   loperamide (IMODIUM A-D) 2 MG tablet   Other Relevant Orders   Ambulatory referral to Gastroenterology   Vitamin D deficiency       Relevant Orders   Vitamin D (25 hydroxy)   IFG (impaired fasting glucose)       Relevant Orders   Hemoglobin A1C   Need for hepatitis C screening test       Relevant Orders   Hepatitis C antibody   Encounter for screening for HIV       Relevant Orders   HIV antibody (with reflex)   Encounter for general adult medical examination with abnormal findings       Relevant Orders   CBC   CMP14+EGFR   Lipid panel   TSH + free T4       Outpatient Encounter Medications as of 07/29/2021  Medication Sig   dicyclomine (BENTYL) 20 MG tablet Take 1 tablet (20 mg total) by mouth in the morning, at noon, in the evening, and at bedtime.   loperamide (IMODIUM A-D) 2 MG tablet Start: 4 mg PO x1, then 2 mg PO after each loose stool until maint.dose established; Max:16 mg/day   omeprazole (PRILOSEC) 40 MG capsule Take 40 mg by mouth daily.   ondansetron (ZOFRAN) 4 MG tablet Take 4 mg by mouth every 8 (eight) hours as needed for nausea or vomiting.   sucralfate (CARAFATE) 1 g tablet Take 1 tablet (1 g total) by mouth 4 (four) times daily. Break tablet in half, allow to dissolve in water and drink in a slurry- 4 times a day for 4 weeks (Patient not taking: Reported on 07/29/2021)   No facility-administered encounter medications on file as of 07/29/2021.    Follow-up: No follow-ups on file.   Alvira Monday, FNP

## 2021-07-29 NOTE — Patient Instructions (Addendum)
I appreciate the opportunity to provide care to you today!    Follow up:  3 months  Labs: please stop by the lab today to get your blood drawn (CBC, CMP, TSH, Lipid profile, HgA1c, Vit D)  Please pick up your medication Dicyclomine and imodium  Increase dietary fiber -Avoid gas producing foods: beans, onions, cabbage, high fructose corn syrup.  -If poor response, use a trail diet of lactose avoidance or gluten avoidance.  Screening: HIV and Hep C   Referrals today-  GI   Please continue to a heart-healthy diet and increase your physical activities. Try to exercise for 55mins at least three times a week.      It was a pleasure to see you and I look forward to continuing to work together on your health and well-being. Please do not hesitate to call the office if you need care or have questions about your care.   Have a wonderful day and week. With Gratitude, Alvira Monday MSN, FNP-BC

## 2021-07-29 NOTE — Assessment & Plan Note (Signed)
Smokes about 1 pack/day  Asked about quitting: confirms that he currently smokes cigarettes Advise to quit smoking: Educated about QUITTING to reduce the risk of cancer, cardio and cerebrovascular disease. Assess willingness: Unwilling to quit at this time, but is working on cutting back. Assist with counseling and pharmacotherapy: Counseled for 5 minutes and literature provided. Arrange for follow up: follow up in 3 months and continue to offer help.  

## 2021-07-30 ENCOUNTER — Other Ambulatory Visit: Payer: Self-pay | Admitting: Family Medicine

## 2021-07-30 DIAGNOSIS — E559 Vitamin D deficiency, unspecified: Secondary | ICD-10-CM

## 2021-07-30 LAB — CMP14+EGFR
ALT: 22 IU/L (ref 0–44)
AST: 20 IU/L (ref 0–40)
Albumin/Globulin Ratio: 2.3 — ABNORMAL HIGH (ref 1.2–2.2)
Albumin: 4.6 g/dL (ref 4.1–5.2)
Alkaline Phosphatase: 47 IU/L (ref 44–121)
BUN/Creatinine Ratio: 11 (ref 9–20)
BUN: 12 mg/dL (ref 6–20)
Bilirubin Total: 0.9 mg/dL (ref 0.0–1.2)
CO2: 23 mmol/L (ref 20–29)
Calcium: 9.5 mg/dL (ref 8.7–10.2)
Chloride: 102 mmol/L (ref 96–106)
Creatinine, Ser: 1.09 mg/dL (ref 0.76–1.27)
Globulin, Total: 2 g/dL (ref 1.5–4.5)
Glucose: 91 mg/dL (ref 70–99)
Potassium: 4.4 mmol/L (ref 3.5–5.2)
Sodium: 139 mmol/L (ref 134–144)
Total Protein: 6.6 g/dL (ref 6.0–8.5)
eGFR: 96 mL/min/{1.73_m2} (ref >59)

## 2021-07-30 LAB — CBC
Hematocrit: 51.3 % — ABNORMAL HIGH (ref 37.5–51.0)
Hemoglobin: 17.9 g/dL — ABNORMAL HIGH (ref 13.0–17.7)
MCH: 32.4 pg (ref 26.6–33.0)
MCHC: 34.9 g/dL (ref 31.5–35.7)
MCV: 93 fL (ref 79–97)
Platelets: 229 10*3/uL (ref 150–450)
RBC: 5.52 x10E6/uL (ref 4.14–5.80)
RDW: 12.6 % (ref 11.6–15.4)
WBC: 17.1 10*3/uL — ABNORMAL HIGH (ref 3.4–10.8)

## 2021-07-30 LAB — LIPID PANEL
Chol/HDL Ratio: 4.5 ratio (ref 0.0–5.0)
Cholesterol, Total: 187 mg/dL (ref 100–199)
HDL: 42 mg/dL (ref >39)
LDL Chol Calc (NIH): 119 mg/dL — ABNORMAL HIGH (ref 0–99)
Triglycerides: 143 mg/dL (ref 0–149)
VLDL Cholesterol Cal: 26 mg/dL (ref 5–40)

## 2021-07-30 LAB — TSH+FREE T4
Free T4: 1.17 ng/dL (ref 0.82–1.77)
TSH: 1.15 u[IU]/mL (ref 0.450–4.500)

## 2021-07-30 LAB — HIV ANTIBODY (ROUTINE TESTING W REFLEX): HIV Screen 4th Generation wRfx: NONREACTIVE

## 2021-07-30 LAB — HEMOGLOBIN A1C
Est. average glucose Bld gHb Est-mCnc: 111 mg/dL
Hgb A1c MFr Bld: 5.5 % (ref 4.8–5.6)

## 2021-07-30 LAB — HEPATITIS C ANTIBODY: Hep C Virus Ab: NONREACTIVE

## 2021-07-30 LAB — VITAMIN D 25 HYDROXY (VIT D DEFICIENCY, FRACTURES): Vit D, 25-Hydroxy: 18 ng/mL — ABNORMAL LOW (ref 30.0–100.0)

## 2021-07-30 MED ORDER — VITAMIN D (ERGOCALCIFEROL) 1.25 MG (50000 UNIT) PO CAPS: 50000.0000 [IU] | ORAL_CAPSULE | ORAL | 1 refills | Status: DC

## 2021-08-03 ENCOUNTER — Telehealth: Payer: Self-pay | Admitting: Family Medicine

## 2021-08-03 NOTE — Telephone Encounter (Signed)
Pt informed of labs

## 2021-08-03 NOTE — Progress Notes (Signed)
Please inform the patient to pick up his prescription for once weekly Vit D because his Vit D is low. His WBC is elevated. This is likely due to his IBS and gastritis. His H&H is slightly elevated but not of concern.  Encouraged him to continue taking his prescribed medicine, and we will continue to monitor

## 2021-08-03 NOTE — Telephone Encounter (Signed)
Pt returning call for labs 

## 2021-08-31 ENCOUNTER — Encounter: Payer: Self-pay | Admitting: Gastroenterology

## 2021-08-31 ENCOUNTER — Ambulatory Visit (INDEPENDENT_AMBULATORY_CARE_PROVIDER_SITE_OTHER): Payer: BC Managed Care – PPO | Admitting: Gastroenterology

## 2021-08-31 VITALS — BP 127/77 | HR 79 | Temp 98.0°F | Ht 65.0 in | Wt 161.8 lb

## 2021-08-31 DIAGNOSIS — K589 Irritable bowel syndrome without diarrhea: Secondary | ICD-10-CM | POA: Insufficient documentation

## 2021-08-31 DIAGNOSIS — K649 Unspecified hemorrhoids: Secondary | ICD-10-CM | POA: Insufficient documentation

## 2021-08-31 DIAGNOSIS — R1012 Left upper quadrant pain: Secondary | ICD-10-CM | POA: Diagnosis not present

## 2021-08-31 DIAGNOSIS — K580 Irritable bowel syndrome with diarrhea: Secondary | ICD-10-CM

## 2021-08-31 DIAGNOSIS — G8929 Other chronic pain: Secondary | ICD-10-CM | POA: Insufficient documentation

## 2021-08-31 DIAGNOSIS — K219 Gastro-esophageal reflux disease without esophagitis: Secondary | ICD-10-CM | POA: Diagnosis not present

## 2021-08-31 NOTE — Patient Instructions (Signed)
Please have your labs done as soon as you can. We will be in touch with results as available. Continue omeprazole 40mg  daily for acid reflux, gastritis. Continue dicyclomine for abdominal pain and diarrhea. Try to take one as soon as you get up. Take no more than four per day. You should try to take at least twice daily as long as you are having symptoms.  Stop loperamide for now. You really need to stop marijuana use. 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 See hand out provided for more information.  Irritable Bowel Syndrome, Adult  Irritable bowel syndrome (IBS) is a group of symptoms that affects the organs responsible for digestion (gastrointestinal tract, or GI tract). IBS is not one specific disease. To regulate how the GI tract works, the body sends signals back and forth between the intestines and the brain. If you have IBS, there may be a problem with these signals. As a result, the GI tract does not function normally. The intestines may become more sensitive and overreact to certain things. This may be especially true when you eat certain foods or when you are under stress. There are four main types of IBS. These may be determined based on the consistency of your stool (feces): IBS with mostly (predominance of) diarrhea. IBS with predominance of constipation. IBS with mixed bowel habits. This includes both diarrhea and constipation. IBS unclassified. This includes IBS that cannot be categorized into one of the other three main types. It is important to know which type of IBS you have. Certain treatments are more likely to be helpful for certain types of IBS. What are the causes? The exact cause of IBS is not known. What increases the risk? You may have a higher risk for IBS if you: Are male. Are younger than 40 years. Have a family history of IBS. Have a mental health condition, such as depression,  anxiety, or post-traumatic stress disorder. Have had a bacterial infection of your GI tract. What are the signs or symptoms? Symptoms of IBS vary from person to person. The main symptom is abdominal pain or discomfort. Other symptoms usually include one or more of the following: Diarrhea, constipation, or both. Swelling or bloating in the abdomen. Feeling full after eating a small or regular-sized meal. Frequent gas. Mucus in the stool. A feeling of having more stool left after a bowel movement. Symptoms tend to come and go. They may be triggered by stress, mental health conditions, or certain foods. How is this diagnosed? This condition may be diagnosed based on a physical exam, your medical history, and your symptoms. You may have tests, such as: Blood tests. Stool test. Colonoscopy. This is a procedure in which your GI tract is viewed with a long, thin, flexible tube. How is this treated? There is no cure for IBS, but treatment can help relieve symptoms. Treatment depends on the type of IBS you have, and may include: Changes to your diet, such as: Avoiding foods that cause symptoms. Drinking more water. Following a low-FODMAP (fermentable oligosaccharides, disaccharides, monosaccharides, and polyols) diet for up to 6 weeks, or as told by your health care provider. FODMAPs are sugars that are hard for some people to digest. Eating more fiber. Eating small meals at the same times every day. Medicines. These may include: Fiber supplements, if you have constipation. Medicine to control diarrhea (antidiarrheal medicines). Medicine to help control muscle tightening (spasms) in your GI tract (  antispasmodic medicines). Medicines to help with mental health conditions, such as antidepressants. Talk therapy or counseling. Working with a dietitian to help create a food plan that is right for you. Managing your stress. Follow these instructions at home: Eating and drinking  Eat a healthy  diet. Eat 5-6 small meals a day. Try to eat meals at about the same times each day. Do not eat large meals. Gradually eat more fiber-rich foods. These include whole grains, fruits, and vegetables. This may be especially helpful if you have IBS with constipation. Eat a diet low in FODMAPs. You may need to avoid foods such as citrus fruits, cabbage, garlic, and onions. Drink enough fluid to keep your urine pale yellow. Keep a journal of foods that seem to trigger symptoms. Avoid foods and drinks that: Contain added sugar. Make your symptoms worse. These may include dairy products, caffeinated drinks, and carbonated drinks. Alcohol use Do not drink alcohol if: Your health care provider tells you not to drink. You are pregnant, may be pregnant, or are planning to become pregnant. If you drink alcohol: Limit how much you have to: 0-1 drink a day for women. 0-2 drinks a day for men. Know how much alcohol is in your drink. In the [place] one drink equals one 12 oz bottle of beer (355 mL), one 5 oz glass of wine (148 mL), or one 1 oz glass of hard liquor (44 mL) General instructions Take over-the-counter and prescription medicines only as told by your health care provider. This includes supplements. Get enough exercise. Do at least 150 minutes of moderate-intensity exercise each week. Manage your stress. Getting enough sleep and exercise can help you manage stress. Keep all follow-up visits. This is important. This includes all visits with your health care provider and therapist. Where to find more information International [employer] for Functional Gastrointestinal Disorders: aboutibs.[employer] Diabetes and Digestive and Kidney Diseases: StageSync.si Contact a health care provider if: You have constant pain. You lose weight. You have diarrhea that gets worse. You have bleeding from the rectum. You vomit often. You have a fever. Get help right away if: You have severe  abdominal pain. You have diarrhea with symptoms of dehydration, such as dizziness or dry mouth. You have bloody or black stools. You have severe abdominal bloating. You have vomiting that does not stop. You have blood in your vomit. Summary Irritable bowel syndrome (IBS) is not one specific disease. It is a group of symptoms that affects digestion. Your intestines may become more sensitive and overreact to certain things. This may be especially true when you eat certain foods or when you are under stress. There is no cure for IBS, but treatment can help relieve symptoms. This information is not intended to replace advice given to you by your health care provider. Make sure you discuss any questions you have with your health care provider. Document Revised: 01/20/2021 Document Reviewed: 01/20/2021 Elsevier Patient Education  2023 [employer] Inc.   Diet for Irritable Bowel Syndrome When you have irritable bowel syndrome (IBS), it is very important to follow the eating habits that are best for your condition. IBS may cause various symptoms, such as pain in the abdomen, constipation, or diarrhea. Choosing the right foods can help to ease the discomfort from these symptoms. Work with your health care provider and dietitian to find the eating plan that will help to control your symptoms. What are tips for following this plan?  Keep a food diary. This will help you  identify foods that cause symptoms. Write down: What you eat and when you eat it. What symptoms you have. When symptoms occur in relation to your meals, such as "pain in abdomen 2 hours after dinner." Eat your meals slowly and in a relaxed setting. Aim to eat 5-6 small meals per day. Do not skip meals. Drink enough fluid to keep your urine pale yellow. Ask your health care provider if you should take an over-the-counter probiotic to help restore healthy bacteria in your gut (digestive tract). Probiotics are foods that contain good  bacteria and yeasts. Your dietitian may have specific dietary recommendations for you based on your symptoms. Your dietitian may recommend that you: Avoid foods that cause symptoms. Talk with your dietitian about other ways to get the same nutrients that are in those problem foods. Avoid foods with gluten. Gluten is a protein that is found in rye, wheat, and barley. Eat more foods that contain soluble fiber. Examples of foods with high soluble fiber include oats, seeds, and certain fruits and vegetables. Take a fiber supplement if told by your dietitian. Reduce or avoid certain foods called FODMAPs. These are foods that contain sugars that are hard for some people to digest. Ask your health care provider which foods to avoid. What foods should I avoid? The following are some foods and drinks that may make your symptoms worse: Fatty foods, such as french fries. Foods that contain gluten, such as pasta and cereal. Dairy products, such as milk, cheese, and ice cream. Spicy foods. Alcohol. Products with caffeine, such as coffee, tea, or chocolate. Carbonated drinks, such as soda. Foods that are high in FODMAPs. These include certain fruits and vegetables. Products with sweeteners such as honey, high fructose corn syrup, sorbitol, and mannitol. The items listed above may not be a complete list of foods and beverages you should avoid. Contact a dietitian for more information. What foods are good sources of fiber? Your health care provider or dietitian may recommend that you eat more foods that contain fiber. Fiber can help to reduce constipation and other IBS symptoms. Add foods with fiber to your diet a little at a time so your body can get used to them. Too much fiber at one time might cause gas and swelling of your abdomen. The following are some foods that are good sources of fiber: Berries, such as raspberries, strawberries, and blueberries. Tomatoes. Carrots. Brown rice. Oats. Seeds, such as  chia and pumpkin seeds. The items listed above may not be a complete list of recommended sources of fiber. Contact your dietitian for more options. Where to find more information International Foundation for Functional Gastrointestinal Disorders: aboutibs.[employer] Diabetes and Digestive and Kidney Diseases: [email or web address].si Summary When you have irritable bowel syndrome (IBS), it is very important to follow the eating habits that are best for your condition. IBS may cause various symptoms, such as pain in the abdomen, constipation, or diarrhea. Choosing the right foods can help to ease the discomfort that comes from symptoms. Your health care provider or dietitian may recommend that you eat more foods that contain fiber. Keep a food diary. This will help you identify foods that cause symptoms. This information is not intended to replace advice given to you by your health care provider. Make sure you discuss any questions you have with your health care provider. Document Revised: 01/19/2021 Document Reviewed: 01/19/2021 Elsevier Patient Education  2023 Else[employer] Inc.   Gastroesophageal Reflux Disease, Adult Gastroesophageal reflux (GER) happens when acid from  the stomach flows up into the tube that connects the mouth and the stomach (esophagus). Normally, food travels down the esophagus and stays in the stomach to be digested. However, when a person has GER, food and stomach acid sometimes move back up into the esophagus. If this becomes a more serious problem, the person may be diagnosed with a disease called gastroesophageal reflux disease (GERD). GERD occurs when the reflux: Happens often. Causes frequent or severe symptoms. Causes problems such as damage to the esophagus. When stomach acid comes in contact with the esophagus, the acid may cause inflammation in the esophagus. Over time, GERD may create small holes (ulcers) in the lining of the esophagus. What are the  causes? This condition is caused by a problem with the muscle between the esophagus and the stomach (lower esophageal sphincter, or LES). Normally, the LES muscle closes after food passes through the esophagus to the stomach. When the LES is weakened or abnormal, it does not close properly, and that allows food and stomach acid to go back up into the esophagus. The LES can be weakened by certain dietary substances, medicines, and medical conditions, including: Tobacco use. Pregnancy. Having a hiatal hernia. Alcohol use. Certain foods and beverages, such as coffee, chocolate, onions, and peppermint. What increases the risk? You are more likely to develop this condition if you: Have an increased body weight. Have a connective tissue disorder. Take NSAIDs, such as ibuprofen. What are the signs or symptoms? Symptoms of this condition include: Heartburn. Difficult or painful swallowing and the feeling of having a lump in the throat. A bitter taste in the mouth. Bad breath and having a large amount of saliva. Having an upset or bloated stomach and belching. Chest pain. Different conditions can cause chest pain. Make sure you see your health care provider if you experience chest pain. Shortness of breath or wheezing. Ongoing (chronic) cough or a nighttime cough. Wearing away of tooth enamel. Weight loss. How is this diagnosed? This condition may be diagnosed based on a medical history and a physical exam. To determine if you have mild or severe GERD, your health care provider may also monitor how you respond to treatment. You may also have tests, including: A test to examine your stomach and esophagus with a small camera (endoscopy). A test that measures the acidity level in your esophagus. A test that measures how much pressure is on your esophagus. A barium swallow or modified barium swallow test to show the shape, size, and functioning of your esophagus. How is this treated? Treatment for  this condition may vary depending on how severe your symptoms are. Your health care provider may recommend: Changes to your diet. Medicine. Surgery. The goal of treatment is to help relieve your symptoms and to prevent complications. Follow these instructions at home: Eating and drinking  Follow a diet as recommended by your health care provider. This may involve avoiding foods and drinks such as: Coffee and tea, with or without caffeine. Drinks that contain alcohol. Energy drinks and sports drinks. Carbonated drinks or sodas. Chocolate and cocoa. Peppermint and mint flavorings. Garlic and onions. Horseradish. Spicy and acidic foods, including peppers, chili powder, curry powder, vinegar, hot sauces, and barbecue sauce. Citrus fruit juices and citrus fruits, such as oranges, lemons, and limes. Tomato-based foods, such as red sauce, chili, salsa, and pizza with red sauce. Fried and fatty foods, such as donuts, french fries, potato chips, and high-fat dressings. High-fat meats, such as hot dogs and fatty cuts of  red and white meats, such as rib eye steak, sausage, ham, and bacon. High-fat dairy items, such as whole milk, butter, and cream cheese. Eat small, frequent meals instead of large meals. Avoid drinking large amounts of liquid with your meals. Avoid eating meals during the 2-3 hours before bedtime. Avoid lying down right after you eat. Do not exercise right after you eat. Lifestyle  Do not use any products that contain nicotine or tobacco. These products include cigarettes, chewing tobacco, and vaping devices, such as e-cigarettes. If you need help quitting, ask your health care provider. Try to reduce your stress by using methods such as yoga or meditation. If you need help reducing stress, ask your health care provider. If you are overweight, reduce your weight to an amount that is healthy for you. Ask your health care provider for guidance about a safe weight loss  goal. General instructions Pay attention to any changes in your symptoms. Take over-the-counter and prescription medicines only as told by your health care provider. Do not take aspirin, ibuprofen, or other NSAIDs unless your health care provider told you to take these medicines. Wear loose-fitting clothing. Do not wear anything tight around your waist that causes pressure on your abdomen. Raise (elevate) the head of your bed about 6 inches (15 cm). You can use a wedge to do this. Avoid bending over if this makes your symptoms worse. Keep all follow-up visits. This is important. Contact a health care provider if: You have: New symptoms. Unexplained weight loss. Difficulty swallowing or it hurts to swallow. Wheezing or a persistent cough. A hoarse voice. Your symptoms do not improve with treatment. Get help right away if: You have sudden pain in your arms, neck, jaw, teeth, or back. You suddenly feel sweaty, dizzy, or light-headed. You have chest pain or shortness of breath. You vomit and the vomit is green, yellow, or black, or it looks like blood or coffee grounds. You faint. You have stool that is red, bloody, or black. You cannot swallow, drink, or eat. These symptoms may represent a serious problem that is an emergency. Do not wait to see if the symptoms will go away. Get medical help right away. Call your local emergency services (911 in the [place] Do not drive yourself to the hospital. Summary Gastroesophageal reflux happens when acid from the stomach flows up into the esophagus. GERD is a disease in which the reflux happens often, causes frequent or severe symptoms, or causes problems such as damage to the esophagus. Treatment for this condition may vary depending on how severe your symptoms are. Your health care provider may recommend diet and lifestyle changes, medicine, or surgery. Contact a health care provider if you have new or worsening symptoms. Take over-the-counter and  prescription medicines only as told by your health care provider. Do not take aspirin, ibuprofen, or other NSAIDs unless your health care provider told you to do so. Keep all follow-up visits as told by your health care provider. This is important. This information is not intended to replace advice given to you by your health care provider. Make sure you discuss any questions you have with your health care provider. Document Revised: 08/19/2019 Document Reviewed: 08/19/2019 Elsevier Patient Education  2023 Else[employer] Inc.   Food Choices for Gastroesophageal Reflux Disease, Adult When you have gastroesophageal reflux disease (GERD), the foods you eat and your eating habits are very important. Choosing the right foods can help ease the discomfort of GERD. Consider working with a dietitian to help  you make healthy food choices. What are tips for following this plan? Reading food labels Look for foods that are low in saturated fat. Foods that have less than 5% of daily value (DV) of fat and 0 g of trans fats may help with your symptoms. Cooking Cook foods using methods other than frying. This may include baking, steaming, grilling, or broiling. These are all methods that do not need a lot of fat for cooking. To add flavor, try to use herbs that are low in spice and acidity. Meal planning  Choose healthy foods that are low in fat, such as fruits, vegetables, whole grains, low-fat dairy products, lean meats, fish, and poultry. Eat frequent, small meals instead of three large meals each day. Eat your meals slowly, in a relaxed setting. Avoid bending over or lying down until 2-3 hours after eating. Limit high-fat foods such as fatty meats or fried foods. Limit your intake of fatty foods, such as oils, butter, and shortening. Avoid the following as told by your health care provider: Foods that cause symptoms. These may be different for different people. Keep a food diary to keep track of foods that cause  symptoms. Alcohol. Drinking large amounts of liquid with meals. Eating meals during the 2-3 hours before bed. Lifestyle Maintain a healthy weight. Ask your health care provider what weight is healthy for you. If you need to lose weight, work with your health care provider to do so safely. Exercise for at least 30 minutes on 5 or more days each week, or as told by your health care provider. Avoid wearing clothes that fit tightly around your waist and chest. Do not use any products that contain nicotine or tobacco. These products include cigarettes, chewing tobacco, and vaping devices, such as e-cigarettes. If you need help quitting, ask your health care provider. Sleep with the head of your bed raised. Use a wedge under the mattress or blocks under the bed frame to raise the head of the bed. Chew sugar-free gum after mealtimes. What foods should I eat?  Eat a healthy, well-balanced diet of fruits, vegetables, whole grains, low-fat dairy products, lean meats, fish, and poultry. Each person is different. Foods that may trigger symptoms in one person may not trigger any symptoms in another person. Work with your health care provider to identify foods that are safe for you. The items listed above may not be a complete list of recommended foods and beverages. Contact a dietitian for more information. What foods should I avoid? Limiting some of these foods may help manage the symptoms of GERD. Everyone is different. Consult a dietitian or your health care provider to help you identify the exact foods to avoid, if any. Fruits Any fruits prepared with added fat. Any fruits that cause symptoms. For some people this may include citrus fruits, such as oranges, grapefruit, pineapple, and lemons. Vegetables Deep-fried vegetables. [place] fries. Any vegetables prepared with added fat. Any vegetables that cause symptoms. For some people, this may include tomatoes and tomato products, chili peppers, onions and  garlic, and horseradish. Grains Pastries or quick breads with added fat. Meats and other proteins High-fat meats, such as fatty beef or pork, hot dogs, ribs, ham, sausage, salami, and bacon. Fried meat or protein, including fried fish and fried chicken. Nuts and nut butters, in large amounts. Dairy Whole milk and chocolate milk. Sour cream. Cream. Ice cream. Cream cheese. Milkshakes. Fats and oils Butter. Margarine. Shortening. Ghee. Beverages Coffee and tea, with or without caffeine. Carbonated  beverages. Sodas. Energy drinks. Fruit juice made with acidic fruits, such as orange or grapefruit. Tomato juice. Alcoholic drinks. Sweets and desserts Chocolate and cocoa. Donuts. Seasonings and condiments Pepper. Peppermint and spearmint. Added salt. Any condiments, herbs, or seasonings that cause symptoms. For some people, this may include curry, hot sauce, or vinegar-based salad dressings. The items listed above may not be a complete list of foods and beverages to avoid. Contact a dietitian for more information. Questions to ask your health care provider Diet and lifestyle changes are usually the first steps that are taken to manage symptoms of GERD. If diet and lifestyle changes do not improve your symptoms, talk with your health care provider about taking medicines. Where to find more information [employer] for Gastrointestinal Disorders: aboutgerd.org Summary When you have gastroesophageal reflux disease (GERD), food and lifestyle choices may be very helpful in easing the discomfort of GERD. Eat frequent, small meals instead of three large meals each day. Eat your meals slowly, in a relaxed setting. Avoid bending over or lying down until 2-3 hours after eating. Limit high-fat foods such as fatty meats or fried foods. This information is not intended to replace advice given to you by your health care provider. Make sure you discuss any questions you have with your health care  provider. Document Revised: 08/19/2019 Document Reviewed: 08/19/2019 Elsevier Patient Education  2023 [employer] Inc.      Cannabinoid Hyperemesis Syndrome Cannabinoid hyperemesis syndrome (CHS) is a condition that causes repeated nausea, vomiting, and abdominal pain after long-term (chronic) use of marijuana (cannabis). People with CHS typically use marijuana 3-5 times a day for many years before they have symptoms, although it is possible to develop CHS with far less daily use. Symptoms of CHS may be mild at first but can get worse and more frequent. In some cases, CHS may cause severe daily vomiting, which can lead to weight loss and dehydration. What are the causes? The exact cause of this condition is not known. Long-term use of marijuana may over-stimulate certain proteins in the brain and gastrointestinal tract that react with chemicals in marijuana (cannabinoid receptors). This over-stimulation may cause CHS. What are the signs or symptoms? Symptoms of this condition are often mild during the first few episodes, but they can get worse over time. Symptoms may include: Frequent nausea, especially early in the morning. Vomiting. This can become severe. Abdominal pain. Feeling very tired (lethargic). Headaches. CHS may go away and come back many times (recur). People may not have symptoms or may otherwise be healthy in between [facility] episodes. Taking hot showers can relieve the symptoms of CHS, so feeling the need to take several hot showers throughout the day can be a sign of this condition. How is this diagnosed? This condition may be diagnosed based on: Your symptoms and medical history, including any drug use. A physical exam. You may have tests done to rule out other problems that could cause your symptoms. These tests may include: Blood tests. Urine tests. Imaging tests, such as an X-ray or a CT scan. How is this treated? Treatment for this condition involves stopping marijuana  use. Treatment may include: A drug rehabilitation program, if you have trouble stopping marijuana use. Medicines for nausea. These may be given at the hospital through an IV inserted into one of your veins, or they may be medicines that you take by mouth (orally). Certain creams that contain a substance called capsaicin. These may improve symptoms when applied to the abdomen. Hot showers to  help relieve symptoms. In severe cases, you may need treatment at a hospital. You may be given IV fluids to prevent or treat dehydration as well as medicines to treat nausea, vomiting, and pain. Follow these instructions at home: During an episode of CHS  Stay in bed and rest in a dark, quiet room. Take anti-nausea medicine as told by your health care provider. Try taking hot showers to relieve your symptoms. After an episode of CHS Drink small amounts of clear fluids slowly. Gradually add more if you can keep the fluids down without vomiting. Once you are able to eat without vomiting, eat soft foods in small amounts every 3-4 hours. General instructions  Do not use any products that contain marijuana.If you need help quitting, ask your health care provider for resources and treatment options. Drink enough fluid to keep your urine pale yellow. Avoid drinking fluids that have a lot of sugar or caffeine, such as coffee and soda. Take and apply over-the-counter and prescription medicines only as told by your health care provider. Ask your health care provider before starting any new medicines or treatments. Keep all follow-up visits as told by your health care provider. This is important. This includes any recommended substance abuse programs. Contact a health care provider if: Your symptoms get worse. You cannot drink fluids without vomiting or severe pain. You have pain and trouble swallowing after an episode. Get help right away if you: Cannot stop vomiting. Have blood in your vomit or your vomit looks  like coffee grounds. Have severe abdominal pain. Have stools that are bloody or black, or stools that look like tar. Have symptoms of dehydration, such as: Sunken eyes. Inability to make tears. Cracked lips. Dry mouth. Decreased urine production. Weakness. Sleepiness. Dizziness, light-headedness, or fainting. Summary Cannabinoid hyperemesis syndrome (CHS) is a condition that causes repeated nausea, vomiting, and abdominal pain after long-term use of marijuana. People with CHS typically use marijuana 3-5 times a day for many years before they have symptoms, although it is possible to develop CHS with much less daily use. Treatment for this condition involves stopping marijuana use. Hot showers and capsaicin creams may also help relieve symptoms. Ask your health care provider before starting any medicines or other treatments. Your health care provider may prescribe medicines to help with nausea. Get help right away if you have signs of dehydration, such as dry mouth, decreased urine production, weakness, dizziness, and light-headedness. This information is not intended to replace advice given to you by your health care provider. Make sure you discuss any questions you have with your health care provider. Document Revised: 01/01/2021 Document Reviewed: 12/05/2018 Elsevier Patient Education  2023 [employer]

## 2021-08-31 NOTE — Progress Notes (Incomplete)
GI Office Note    Referring Provider: Gilmore Laroche, FNP Primary Care Physician:  Gilmore Laroche, FNP  Primary Gastroenterologist:  Chief Complaint   Chief Complaint  Patient presents with   Abdominal Pain    Upper left abdominal pain     History of Present Illness   Jacob Bradley is a 26 y.o. male presenting today for further evaluation of IBS-D at the request of Gilmore Laroche FNP.  Patient seen by our office previously in 2020 however he transferred his care to [facility] and was seen last in May 2022.  Seen by Doug Sou, PA-C in May 2022 for ongoing complaints of vomiting, upper abdominal pain in the setting of regular marijuana use, since "26 years old".  According to her note, patient states he stopped marijuana for short time but could not function because of pain and nausea.  Also smokes marijuana to help with anxiety.    Extensive work-up previously including EGD and colonoscopy at [facility], multiple CT scans, 2 abdominal ultrasounds, HIDA scan all since 2020.  Gastric emptying study normal June 2022.    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    He also saw a general surgeon, Dr. Arlyn Dunning, April 2022 for ongoing left upper quadrant pain.  She completed HIDA scan which was unremarkable.  According to her note, she did not feel like his symptoms were classic for biliary colic.  Colonoscopy February 2022: Hemorrhoids found on perianal exam, diverticulosis, nonbleeding internal hemorrhoids.  Repeat colonoscopy at age 49 for screening purposes.  EGD February 2022: Several mucosal breaks in the esophagus, immediately distal to the Z-line with mild mucosal oozing.  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  Today: Patient states that he has been sick for over 3 years now.  He denies chronic GI symptoms dating back to his teenage years although  this was outlined in multiple records in the past.  He associates his severe symptoms occurring 3 years ago.  He was working at [employer] at the time, drinking a lot of energy drinks.  He thought that that was contributing to his symptoms.  He has since cut back and only drinks 1 energy drink per day.  Symptoms consist of waking up early in the morning due to abdominal pain left upper quadrant or he develops abdominal pain shortly after bowel movement first thing in the morning. Typically pain is always in this location although last week he noted that it radiated into his lower abdomen and around into the right flank.  Symptoms are poorly associated with nausea to the point that he does not feel like he can take his medications.  He has to wait to nausea settles down before he can drink any liquids and take his medication.  He notes that vomiting lately has been less frequent but typically does occur in the mornings.  Often not able to eat food until dinner.  Nausea/vomiting typically worse with certain foods such as tomato-based foods, acidic foods, hot dogs, dairy with exception of cheese.  He notes difficulty with smells because of nausea.  He has some epigastric burning but denies any significant heartburn.  He notes he has not worked in 3 years because of his GI symptoms.  States his wife does not want him to get a job until he is sure that he will lose it because of his symptoms.  Symptoms are also  associated with loose stools, worse in the mornings, typically can go 4-5 times before lunch.  No melena.  Bright red blood per rectum couple of times per week, no rectal pain.  Just recently started dicyclomine, does not take 4 times daily but tries to take in the morning once his nausea subsides.  Also takes if his abdominal pain is escalating.  Takes loperamide for diarrhea which seems to help sometimes.  Patient also tries to eat bland food to help with the symptoms.  He switch from white bread which was  hurting his stomach, now eating wheat bread.  Patient also notes that taking multiple hot showers gets relief of his abdominal pain and nausea.  He took 4 showers prior to today's visit.  He notes that he has taken his mom's Suboxone for pain at times.  He also feels that marijuana provides relief.  He states he has an "anxiety medication" that a psychiatrist gave him that he uses for bad episodes of anxiety/PTSD.  Denies ever being on a daily regimen to control his anxiety/PTSD.  States he has not seen his psychiatrist in "a while". PTSD from "terrible childhood".   Patient states that he recently transferred his care from Dr. Mitzi Hansen to current PCP because he did not feel like his symptoms were addressed.      Medications   Current Outpatient Medications  Medication Sig Dispense Refill   dicyclomine (BENTYL) 20 MG tablet Take 1 tablet (20 mg total) by mouth in the morning, at noon, in the evening, and at bedtime. 60 tablet 2   loperamide (IMODIUM A-D) 2 MG tablet Start: 4 mg PO x1, then 2 mg PO after each loose stool until maint.dose established; Max:16 mg/day 30 tablet 0   omeprazole (PRILOSEC) 40 MG capsule Take 40 mg by mouth daily.     promethazine (PHENERGAN) 25 MG tablet Take 25 mg by mouth every 6 (six) hours as needed for nausea or vomiting.     No current facility-administered medications for this visit.    Allergies   Allergies as of 08/31/2021   (No Known Allergies)    Past Medical History   Past Medical History:  Diagnosis Date   Bipolar disorder [facility])     Past Surgical History   Past Surgical History:  Procedure Laterality Date   COLONOSCOPY     none     UPPER GASTROINTESTINAL ENDOSCOPY      Past Family History   Family History  Problem Relation Age of Onset   Cirrhosis Mother        etoh   Colon cancer Neg Hx    Inflammatory bowel disease Neg Hx    Celiac disease Neg Hx    Esophageal cancer Neg Hx    Pancreatic cancer Neg Hx    Stomach  cancer Neg Hx     Past Social History   Social History   Socioeconomic History   Marital status: Married    Spouse name: Not on file   Number of children: Not on file   Years of education: Not on file   Highest education level: Not on file  Occupational History   Not on file  Tobacco Use   Smoking status: Every Day    Packs/day: 1.00    Years: 8.00    Total pack years: 8.00    Types: Cigarettes   Smokeless tobacco: Never  Vaping Use   Vaping Use: Never used  Substance and Sexual Activity   Alcohol use: Yes  Comment: Rarely, never heavy   Drug use: Yes    Types: Marijuana   Sexual activity: Yes  Other Topics Concern   Not on file  Social History Narrative   Not on file   Social Determinants of Health   Financial Resource Strain: Not on file  Food Insecurity: Not on file  Transportation Needs: Not on file  Physical Activity: Not on file  Stress: Not on file  Social Connections: Not on file  Intimate Partner Violence: Not on file    Review of Systems   General: Negative for anorexia, weight loss, fever, chills, fatigue, weakness. Eyes: Negative for vision changes.  ENT: Negative for hoarseness, difficulty swallowing , nasal congestion. CV: Negative for chest pain, angina, palpitations, dyspnea on exertion, peripheral edema.  Respiratory: Negative for dyspnea at rest, dyspnea on exertion, cough, sputum, wheezing.  GI: See history of present illness. GU:  Negative for dysuria, hematuria, urinary incontinence, urinary frequency, nocturnal urination.  MS: Negative for joint pain, low back pain.  Derm: Negative for rash or itching.  Neuro: Negative for weakness, abnormal sensation, seizure, frequent headaches, memory loss,  confusion.  Psych: Negative for  suicidal ideation, hallucinations. See hpi.  Endo: Negative for unusual weight change.  Heme: Negative for bruising or bleeding. Allergy: Negative for rash or hives.  Physical Exam   BP 127/77 (BP  Location: Left Arm, Patient Position: Sitting, Cuff Size: Normal)   Pulse 79   Temp 98 F (36.7 C) (Temporal)   Ht 5\' 5"  (1.651 m)   Wt 161 lb 12.8 oz (73.4 kg)   SpO2 95%   BMI 26.92 kg/m    General: Well-nourished, well-developed in no acute distress.  Head: Normocephalic, atraumatic.   Eyes: Conjunctiva pink, no icterus. Mouth: Oropharyngeal mucosa moist and pink , no lesions erythema or exudate. Neck: Supple without thyromegaly, masses, or lymphadenopathy.  Lungs: Clear to auscultation bilaterally.  Heart: Regular rate and rhythm, no murmurs rubs or gallops.  Abdomen: Bowel sounds are normal, nondistended, no hepatosplenomegaly or masses,  no abdominal bruits or hernia, no rebound or guarding.  Mild to moderate tenderness with palpation of luq and diffuse in lower abdomen to light palpation. Grimaces.  Rectal: not performed Extremities: No lower extremity edema. No clubbing or deformities.  Neuro: Alert and oriented x 4 , grossly normal neurologically.  Skin: Warm and dry, no rash or jaundice.   Psych: Alert and cooperative, normal mood and affect.  Labs   Lab Results  Component Value Date   CREATININE 1.09 07/29/2021   BUN 12 07/29/2021   NA 139 07/29/2021   K 4.4 07/29/2021   CL 102 07/29/2021   CO2 23 07/29/2021   Lab Results  Component Value Date   ALT 22 07/29/2021   AST 20 07/29/2021   ALKPHOS 47 07/29/2021   BILITOT 0.9 07/29/2021   Lab Results  Component Value Date   TSH 1.150 07/29/2021   Lab Results  Component Value Date   HGBA1C 5.5 07/29/2021   Lab Results  Component Value Date   WBC 17.1 (H) 07/29/2021   HGB 17.9 (H) 07/29/2021   HCT 51.3 (H) 07/29/2021   MCV 93 07/29/2021   PLT 229 07/29/2021    Imaging Studies   No results found.  Assessment   LUQ pain:  GERD:  IBS-D:  Rectal bleeding:   PLAN   ***   09/28/2021. Leanna Battles, MHS, PA-C [facility]

## 2021-09-01 ENCOUNTER — Encounter: Payer: Self-pay | Admitting: Gastroenterology

## 2021-09-02 LAB — CBC WITH DIFFERENTIAL/PLATELET
Basophils Absolute: 0.1 10*3/uL (ref 0.0–0.2)
Basos: 1 %
EOS (ABSOLUTE): 0.2 10*3/uL (ref 0.0–0.4)
Eos: 1 %
Hematocrit: 46.6 % (ref 37.5–51.0)
Hemoglobin: 16.3 g/dL (ref 13.0–17.7)
Immature Grans (Abs): 0 10*3/uL (ref 0.0–0.1)
Immature Granulocytes: 0 %
Lymphocytes Absolute: 4.1 10*3/uL — ABNORMAL HIGH (ref 0.7–3.1)
Lymphs: 37 %
MCH: 32.1 pg (ref 26.6–33.0)
MCHC: 35 g/dL (ref 31.5–35.7)
MCV: 92 fL (ref 79–97)
Monocytes Absolute: 0.8 10*3/uL (ref 0.1–0.9)
Monocytes: 7 %
Neutrophils Absolute: 6 10*3/uL (ref 1.4–7.0)
Neutrophils: 54 %
Platelets: 233 10*3/uL (ref 150–450)
RBC: 5.08 x10E6/uL (ref 4.14–5.80)
RDW: 12.7 % (ref 11.6–15.4)
WBC: 11.1 10*3/uL — ABNORMAL HIGH (ref 3.4–10.8)

## 2021-09-02 LAB — IGA: IgA/Immunoglobulin A, Serum: 95 mg/dL (ref 90–386)

## 2021-09-02 LAB — HEPATIC FUNCTION PANEL
ALT: 17 IU/L (ref 0–44)
AST: 21 IU/L (ref 0–40)
Albumin: 4.4 g/dL (ref 4.3–5.2)
Alkaline Phosphatase: 48 IU/L (ref 44–121)
Bilirubin Total: 0.6 mg/dL (ref 0.0–1.2)
Bilirubin, Direct: 0.15 mg/dL (ref 0.00–0.40)
Total Protein: 6.4 g/dL (ref 6.0–8.5)

## 2021-09-02 LAB — LIPASE: Lipase: 15 U/L (ref 13–78)

## 2021-09-02 LAB — AMYLASE: Amylase: 45 U/L (ref 31–110)

## 2021-09-02 LAB — TISSUE TRANSGLUTAMINASE, IGA: Transglutaminase IgA: <2 U/mL (ref 0–3)

## 2021-09-02 LAB — SEDIMENTATION RATE: Sed Rate: 2 mm/hr (ref 0–15)

## 2021-09-02 LAB — C-REACTIVE PROTEIN: CRP: <1 mg/L (ref 0–10)

## 2021-09-06 ENCOUNTER — Encounter: Payer: Self-pay | Admitting: Internal Medicine

## 2021-10-19 ENCOUNTER — Ambulatory Visit: Payer: BC Managed Care – PPO | Admitting: Internal Medicine

## 2021-10-29 ENCOUNTER — Ambulatory Visit: Payer: BC Managed Care – PPO | Admitting: Family Medicine

## 2021-11-01 ENCOUNTER — Ambulatory Visit: Payer: BC Managed Care – PPO | Admitting: Family Medicine

## 2021-11-01 ENCOUNTER — Encounter: Payer: Self-pay | Admitting: Family Medicine

## 2022-01-10 ENCOUNTER — Other Ambulatory Visit: Payer: Self-pay

## 2022-01-10 ENCOUNTER — Encounter (HOSPITAL_COMMUNITY): Payer: Self-pay | Admitting: *Deleted

## 2022-01-10 ENCOUNTER — Emergency Department (HOSPITAL_COMMUNITY)
Admission: EM | Admit: 2022-01-10 | Discharge: 2022-01-10 | Disposition: A | Discharge status: Home / Self Care | Payer: BC Managed Care – PPO | Attending: Emergency Medicine | Admitting: Emergency Medicine

## 2022-01-10 DIAGNOSIS — R1012 Left upper quadrant pain: Secondary | ICD-10-CM | POA: Diagnosis not present

## 2022-01-10 DIAGNOSIS — R1032 Left lower quadrant pain: Secondary | ICD-10-CM | POA: Insufficient documentation

## 2022-01-10 DIAGNOSIS — R109 Unspecified abdominal pain: Secondary | ICD-10-CM | POA: Diagnosis not present

## 2022-01-10 LAB — COMPREHENSIVE METABOLIC PANEL
ALT: 22 U/L (ref 0–44)
AST: 27 U/L (ref 15–41)
Albumin: 4.2 g/dL (ref 3.5–5.0)
Alkaline Phosphatase: 41 U/L (ref 38–126)
Anion gap: 4 — ABNORMAL LOW (ref 5–15)
BUN: 17 mg/dL (ref 6–20)
CO2: 25 mmol/L (ref 22–32)
Calcium: 9 mg/dL (ref 8.9–10.3)
Chloride: 109 mmol/L (ref 98–111)
Creatinine, Ser: 0.9 mg/dL (ref 0.61–1.24)
GFR, Estimated: >60 mL/min (ref >60)
Glucose, Bld: 105 mg/dL — ABNORMAL HIGH (ref 70–99)
Potassium: 3.8 mmol/L (ref 3.5–5.1)
Sodium: 138 mmol/L (ref 135–145)
Total Bilirubin: 1.8 mg/dL — ABNORMAL HIGH (ref 0.3–1.2)
Total Protein: 7 g/dL (ref 6.5–8.1)

## 2022-01-10 LAB — URINALYSIS, ROUTINE W REFLEX MICROSCOPIC
Bacteria, UA: NONE SEEN
Bilirubin Urine: NEGATIVE
Glucose, UA: NEGATIVE mg/dL
Hgb urine dipstick: NEGATIVE
Ketones, ur: 5 mg/dL — AB
Leukocytes,Ua: NEGATIVE
Nitrite: NEGATIVE
Protein, ur: 100 mg/dL — AB
Specific Gravity, Urine: 1.027 (ref 1.005–1.030)
pH: 6 (ref 5.0–8.0)

## 2022-01-10 LAB — CBC
HCT: 40.8 % (ref 39.0–52.0)
Hemoglobin: 14.5 g/dL (ref 13.0–17.0)
MCH: 32.4 pg (ref 26.0–34.0)
MCHC: 35.5 g/dL (ref 30.0–36.0)
MCV: 91.1 fL (ref 80.0–100.0)
Platelets: 226 10*3/uL (ref 150–400)
RBC: 4.48 MIL/uL (ref 4.22–5.81)
RDW: 12.1 % (ref 11.5–15.5)
WBC: 16.5 10*3/uL — ABNORMAL HIGH (ref 4.0–10.5)
nRBC: 0 % (ref 0.0–0.2)

## 2022-01-10 LAB — LIPASE, BLOOD: Lipase: 29 U/L (ref 11–51)

## 2022-01-10 MED ORDER — DROPERIDOL 2.5 MG/ML IJ SOLN: 1.2500 mg | Freq: Once | INTRAMUSCULAR | Status: DC

## 2022-01-10 MED ORDER — HALOPERIDOL LACTATE 5 MG/ML IJ SOLN
1.0000 mg | Freq: Once | INTRAMUSCULAR | Status: AC
  Administered 2022-01-10: 1 mg via INTRAVENOUS
  Filled 2022-01-10: qty 1

## 2022-01-10 NOTE — ED Notes (Signed)
Pt calling out stating he was ready to leave. Pt agitated because he feels like we "never do anything for him". States we "just dope him up and send him home telling him it's because of the pot he smokes". Derrek Gu, PA at bedside and stated he would get the pt ready for discharge.

## 2022-01-10 NOTE — ED Provider Notes (Signed)
[facility] EMERGENCY DEPARTMENT Provider Note   CSN: [id number] Arrival date & time: 01/10/22  1727     History  Chief Complaint  Patient presents with   Abdominal Pain    Jacob Bradley is a 26 y.o. male.  With a history of chronic abdominal pain, cyclical vomiting, persistent marijuana use, depression, bipolar, PTSD, irritable bowel syndrome, GERD who presents to the ED for evaluation of left-sided abdominal pain, nausea and vomiting.  States the symptoms have been persistent for the past 2 to 2-1/2 years.  Has seen multiple providers and had numerous work-ups done without identifying a cause for his pain.  He states he typically smokes marijuana which makes the pain go away, however it always returns.  Did smoke marijuana yesterday.  States he has abdominal pain at baseline, however it got significantly worse this morning.  Denies fevers, chills, chest pain, shortness of breath, dysuria, frequency, urgency, melena, hematochezia, hematemesis, history of abdominal surgery.  Rated his pain at a 3 out of 10 on initial evaluation.   Abdominal Pain Associated symptoms: nausea and vomiting        Home Medications Prior to Admission medications   Medication Sig Start Date End Date Taking? Authorizing Provider  dicyclomine (BENTYL) 20 MG tablet Take 1 tablet (20 mg total) by mouth in the morning, at noon, in the evening, and at bedtime. 07/29/21   Gilmore Laroche, FNP  loperamide (IMODIUM A-D) 2 MG tablet Start: 4 mg PO x1, then 2 mg PO after each loose stool until maint.dose established; Max:16 mg/day 07/29/21   Gilmore Laroche, FNP  omeprazole (PRILOSEC) 40 MG capsule Take 40 mg by mouth daily.    [provider]  promethazine (PHENERGAN) 25 MG tablet Take 25 mg by mouth every 6 (six) hours as needed for nausea or vomiting.    [provider]      Allergies    Patient has no known allergies.    Review of Systems   Review of Systems  Gastrointestinal:  Positive for  abdominal pain, nausea and vomiting.  All other systems reviewed and are negative.   Physical Exam Updated Vital Signs BP 123/64   Pulse 89   Temp 97.8 F (36.6 C) (Oral)   Resp 17   Ht 5\' 4"  (1.626 m)   Wt 72.6 kg   SpO2 98%   BMI 27.46 kg/m  Physical Exam Vitals and nursing note reviewed.  Constitutional:      General: He is not in acute distress.    Appearance: He is well-developed. He is not ill-appearing, toxic-appearing or diaphoretic.  HENT:     Head: Normocephalic and atraumatic.  Eyes:     Conjunctiva/sclera: Conjunctivae normal.  Cardiovascular:     Rate and Rhythm: Normal rate and regular rhythm.     Heart sounds: No murmur heard. Pulmonary:     Effort: Pulmonary effort is normal. No respiratory distress.     Breath sounds: Normal breath sounds. No stridor. No wheezing, rhonchi or rales.  Abdominal:     General: Abdomen is flat. There is no distension.     Palpations: Abdomen is soft.     Tenderness: There is abdominal tenderness in the suprapubic area, left upper quadrant and left lower quadrant. There is no guarding or rebound. Negative signs include Murphy's sign, Rovsing's sign, McBurney's sign, psoas sign and obturator sign.  Musculoskeletal:        General: No swelling.     Cervical back: Neck supple.  Skin:  General: Skin is warm and dry.     Capillary Refill: Capillary refill takes less than 2 seconds.  Neurological:     General: No focal deficit present.     Mental Status: He is alert and oriented to person, place, and time.  Psychiatric:        Mood and Affect: Mood normal.        Behavior: Behavior normal.     ED Results / Procedures / Treatments   Labs (all labs ordered are listed, but only abnormal results are displayed) Labs Reviewed  COMPREHENSIVE METABOLIC PANEL - Abnormal; Notable for the following components:      Result Value   Glucose, Bld 105 (*)    Total Bilirubin 1.8 (*)    Anion gap 4 (*)    All other components within  normal limits  CBC - Abnormal; Notable for the following components:   WBC 16.5 (*)    All other components within normal limits  URINALYSIS, ROUTINE W REFLEX MICROSCOPIC - Abnormal; Notable for the following components:   Color, Urine AMBER (*)    APPearance HAZY (*)    Ketones, ur 5 (*)    Protein, ur 100 (*)    All other components within normal limits  LIPASE, BLOOD    EKG None  Radiology No results found.  Procedures Procedures    Medications Ordered in ED Medications  haloperidol lactate (HALDOL) injection 1 mg (1 mg Intravenous Given 01/10/22 1954)    ED Course/ Medical Decision Making/ A&P                           Medical Decision Making Amount and/or Complexity of Data Reviewed Labs: ordered.  Risk Prescription drug management.  This patient presents to the ED for concern of abdominal pain, this involves an extensive number of treatment options, and is a complaint that carries with it a high risk of complications and morbidity. The differential diagnosis for generalized abdominal pain includes, but is not limited to AAA, gastroenteritis, appendicitis, Bowel obstruction, Bowel perforation. Gastroparesis, DKA, Hernia, Inflammatory bowel disease, mesenteric ischemia, pancreatitis, peritonitis SBP, volvulus.    Co morbidities that complicate the patient evaluation  chronic abdominal pain, cyclical vomiting, persistent marijuana use, depression, bipolar, PTSD, irritable bowel syndrome, GERD  My initial workup includes abdominal pain labs, Haldol  Additional history obtained from: Nursing notes from this visit. Previous records within EMR system office visit on 08/31/2021 with gastroenterology describing to patient that he likely has cannabis hyperemesis along with IBS and that he should stop smoking marijuana.  These were final recommendations after significant work-up was performed. EMS provides a portion of the history.  EMS gave a total of 300 mcg of fentanyl  and 8 mg of Zofran prior to arrival  I ordered, reviewed and interpreted labs which include: CMP, CBC, lipase, urinalysis.  CBC significant for leukocytosis of 16.5.  This is likely reactive to the pain.   Cardiac Monitoring:  The patient was maintained on a cardiac monitor.  I personally viewed and interpreted the cardiac monitored which showed an underlying rhythm of: NSR   Afebrile, hemodynamically stable.  26 year old male presenting to the ED for evaluation of abdominal pain.  Pain is similar in characteristic to the pain that he has had for the past 2 to 2-1/2 years.  Pain typically presents shortly after he smokes marijuana.  Patient did smoke marijuana yesterday.  Has had an extensive GI work-up which has so  far been unremarkable.  In the absence of fever or change in his symptoms, I do not see need for imaging at this time.  Patient was given haloperidol for possible cyclical vomiting versus cannabis hyperemesis.  After administration of this, patient stated that his symptoms have nearly resolved, however patient became visibly upset that we are not performing CT scan today and does not believe his pain to be secondary to his marijuana use and wanted to be discharged prior to his labs being resulted.  I did initially agree to this, however did not have the chance to discuss return precautions with him.  He did elope prior to being given his discharge paperwork or discharge education.  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  At this time there does not appear to be any evidence of an acute emergency medical condition and the patient appears stable for discharge with appropriate outpatient follow up.All questions answered.  Patient's case discussed with Dr. Renaye Rakers who agrees with plan to discharge with follow-up.   Note: Portions of this report may have been transcribed using voice  recognition software. Every effort was made to ensure accuracy; however, inadvertent computerized transcription errors may still be present.          Final Clinical Impression(s) / ED Diagnoses Final diagnoses:  None    Rx / DC Orders ED Discharge Orders     None         Mora Bellman 01/10/22 2106    Terald Sleeper, MD 01/10/22 2317

## 2022-01-10 NOTE — ED Triage Notes (Signed)
Pt brought in by [facility] from home with c/o mid left sided abdominal pain and n/v that started this morning at 0700. Pt reports he has chronic abdominal pain x 2-2.5 years now, with no diagnosis because he hasn't been able to go to the doctor. EMS reported pt was extremely diaphoretic, pale, curled up in a ball sitting in a chair in the bathroom when they arrived. Pt was given a total of of Fentanyl and 8mg  of Zofran by EMS -   1605-Fentanyl IV by EMS 1610-Fentanyl IV by EMS 1615-Zofran 4mg  IV by EMS 1618-Fentanyl IV by EMS 1630-Zofran 4mg  IV by EMS 1655-Fentanyl IV by EMS  Pt reports the pain is now down to a 3 of 4. Describes the pain as a tightness. Denies nausea currently. Pt was placed on O2 via [place] by EMS during medication administration due to O2 sat dropping when he fell asleep. Pt now alert and oriented with O2 sat 95% on RA.

## 2022-01-10 NOTE — ED Notes (Signed)
Pt left without waiting for his discharge paperwork.

## 2022-01-11 ENCOUNTER — Telehealth: Payer: Self-pay

## 2022-01-11 NOTE — Telephone Encounter (Signed)
Transition Care Management Unsuccessful Follow-up Telephone Call  Date of discharge and from where:  Jacob [facility] 01/10/2022  Attempts:  1st Attempt  Reason for unsuccessful TCM follow-up call:  Left voice message Karena Addison, LPN [facility] Nurse Health Advisor Direct Dial [phone]

## 2022-01-12 ENCOUNTER — Encounter: Payer: Self-pay | Admitting: Family Medicine

## 2022-01-12 ENCOUNTER — Ambulatory Visit (INDEPENDENT_AMBULATORY_CARE_PROVIDER_SITE_OTHER): Payer: BC Managed Care – PPO | Admitting: Family Medicine

## 2022-01-12 VITALS — BP 132/68 | HR 90 | Ht 65.0 in | Wt 159.1 lb

## 2022-01-12 DIAGNOSIS — Z720 Tobacco use: Secondary | ICD-10-CM | POA: Diagnosis not present

## 2022-01-12 DIAGNOSIS — K219 Gastro-esophageal reflux disease without esophagitis: Secondary | ICD-10-CM | POA: Diagnosis not present

## 2022-01-12 DIAGNOSIS — F411 Generalized anxiety disorder: Secondary | ICD-10-CM | POA: Diagnosis not present

## 2022-01-12 DIAGNOSIS — R1012 Left upper quadrant pain: Secondary | ICD-10-CM | POA: Diagnosis not present

## 2022-01-12 DIAGNOSIS — K580 Irritable bowel syndrome with diarrhea: Secondary | ICD-10-CM

## 2022-01-12 DIAGNOSIS — Z1388 Encounter for screening for disorder due to exposure to contaminants: Secondary | ICD-10-CM

## 2022-01-12 MED ORDER — DICYCLOMINE HCL 20 MG PO TABS: 20.0000 mg | ORAL_TABLET | Freq: Four times a day (QID) | ORAL | 2 refills | Status: DC

## 2022-01-12 MED ORDER — SERTRALINE HCL 25 MG PO TABS: 25.0000 mg | ORAL_TABLET | Freq: Every day | ORAL | 1 refills | Status: DC

## 2022-01-12 MED ORDER — OMEPRAZOLE 40 MG PO CPDR: 40.0000 mg | DELAYED_RELEASE_CAPSULE | Freq: Every day | ORAL | 1 refills | Status: DC | PRN

## 2022-01-12 NOTE — Telephone Encounter (Signed)
Transition Care Management Follow-up Telephone Call Date of discharge and from where: [facility] 01/10/2022 How have you been since you were released from the hospital? Still having pain Any questions or concerns? No  Items Reviewed: Did the pt receive and understand the discharge instructions provided? Yes  Medications obtained and verified? Yes  Other? No  Any new allergies since your discharge? No  Dietary orders reviewed? Yes Do you have support at home? Yes   Home Care and Equipment/Supplies: Were home health services ordered? no If so, what is the name of the agency? N/a  Has the agency set up a time to come to the patient's home? no Were any new equipment or medical supplies ordered?  No What is the name of the medical supply agency? N/a Were you able to get the supplies/equipment? not applicable Do you have any questions related to the use of the equipment or supplies? No  Functional Questionnaire: (I = Independent and D = Dependent) ADLs: I  Bathing/Dressing- I  Meal Prep- I  Eating- I  Maintaining continence- I  Transferring/Ambulation- I  Managing Meds- I  Follow up appointments reviewed:  PCP Hospital f/u appt confirmed? Yes  Scheduled to see [name] on 01/12/2022 @ 9:20. Specialist Hospital f/u appt confirmed? No  Patient will schedule with GI Are transportation arrangements needed? No  If their condition worsens, is the pt aware to call PCP or go to the Emergency Dept.? Yes Was the patient provided with contact information for the PCP's office or ED? Yes Was to pt encouraged to call back with questions or concerns? Yes  .lh

## 2022-01-12 NOTE — Progress Notes (Signed)
Established Patient Office Visit  Subjective:  Patient ID: Jacob Bradley, male    DOB: 03/18/1995  Age: 26 y.o. MRN: [id number]  CC:  Chief Complaint  Patient presents with   Follow-up    Pt reports abdominal pain still present, pain is consistent the whole day, onset of this began 2 years ago on and off, has been getting worse. Pt reports pain under his left rib, pain radiates to his back.     HPI Jacob Bradley is a 26 y.o. male with past medical history of  chronic abdominal pain, cyclical vomiting, persistent marijuana use, depression, bipolar, PTSD, irritable bowel syndrome, GERD  presents for f/u of  chronic medical conditions.  Left lower quadrant abdominal pain: He followed up with GI on 08/31/2021 and was seen in the ED on 01/10/2022 for left upper quadrant pain that has been persistent for the past 2 to 2-1/2 years.  He reports quitting marijuana in the past month  but notes smoking 1 pack of cigarettes daily.  He reports that he has cut back from 2 packs daily.  He denies nausea, vomiting, and fever.  He reports that he stopped drinking 3 months ago but occasionally takes shots during social gatherings.  He rates his abdominal pain 10 out of 10 in the morning.  His abdominal pain is 3 out of 10 in the clinic today.  He notes that he has been out of his medication and needs refills on his the calamine and omeprazole.  He complains of abdominal pain in the morning, when eating, and during defecation.He reports working at [employer] on and off for 5-6 years and would like to be screened for lead poisioning   Anxiety and PTSD: GAD-7 is 19.  He reports increased anxiety reporting that he is always anxious, uneasy, and has a quick temper.  He reports onset of symptoms since childhood.  He denies suicidal  and homicidal ideation     Past Medical History:  Diagnosis Date   Bipolar disorder ([place])    PTSD (post-traumatic stress disorder)     Past Surgical History:  Procedure  Laterality Date   COLONOSCOPY     none     UPPER GASTROINTESTINAL ENDOSCOPY      Family History  Problem Relation Age of Onset   Cirrhosis Mother        etoh   Colon cancer Neg Hx    Inflammatory bowel disease Neg Hx    Celiac disease Neg Hx    Esophageal cancer Neg Hx    Pancreatic cancer Neg Hx    Stomach cancer Neg Hx     Social History   Socioeconomic History   Marital status: Married    Spouse name: Not on file   Number of children: 1   Years of education: Not on file   Highest education level: Not on file  Occupational History   Not on file  Tobacco Use   Smoking status: Every Day    Packs/day: 1.00    Years: 8.00    Total pack years: 8.00    Types: Cigarettes   Smokeless tobacco: Never  Vaping Use   Vaping Use: Never used  Substance and Sexual Activity   Alcohol use: Not Currently   Drug use: Yes    Frequency: 3.0 times per week    Types: Marijuana   Sexual activity: Yes  Other Topics Concern   Not on file  Social History Narrative   Not on file  Social Determinants of Health   Financial Resource Strain: Not on file  Food Insecurity: Not on file  Transportation Needs: Not on file  Physical Activity: Not on file  Stress: Not on file  Social Connections: Not on file  Intimate Partner Violence: Not on file    Outpatient Medications Prior to Visit  Medication Sig Dispense Refill   loperamide (IMODIUM A-D) 2 MG tablet Start: 4 mg PO x1, then 2 mg PO after each loose stool until maint.dose established; Max:16 mg/day (Patient not taking: Reported on 01/12/2022) 30 tablet 0   promethazine (PHENERGAN) 25 MG tablet Take 25 mg by mouth every 6 (six) hours as needed for nausea or vomiting. (Patient not taking: Reported on 01/12/2022)     dicyclomine (BENTYL) 20 MG tablet Take 1 tablet (20 mg total) by mouth in the morning, at noon, in the evening, and at bedtime. (Patient not taking: Reported on 01/12/2022) 60 tablet 2   omeprazole (PRILOSEC) 40 MG capsule  Take 40 mg by mouth daily. (Patient not taking: Reported on 01/12/2022)     No facility-administered medications prior to visit.    No Known Allergies  ROS Review of Systems  Eyes:  Negative for visual disturbance.  Respiratory:  Negative for chest tightness and shortness of breath.   Gastrointestinal:  Positive for abdominal pain. Negative for constipation, diarrhea, nausea and vomiting.  Neurological:  Negative for dizziness and headaches.  Psychiatric/Behavioral:  Negative for self-injury and suicidal ideas.       Objective:    Physical Exam HENT:     Head: Normocephalic.     Right Ear: External ear normal.     Left Ear: External ear normal.  Cardiovascular:     Rate and Rhythm: Normal rate and regular rhythm.     Pulses: Normal pulses.     Heart sounds: Normal heart sounds.  Abdominal:     Tenderness: There is no right CVA tenderness, left CVA tenderness, guarding or rebound.     Comments: Negative:  Cullen's Sign and Grey Turner's sign Bowel sounds are active in the 4 quadrants  Musculoskeletal:     Cervical back: No rigidity.  Neurological:     Mental Status: He is alert.     BP 132/68   Pulse 90   Ht _0  (1.651 m)   Wt 159 lb 1.3 oz (72.2 kg)   SpO2 95%   BMI 26.47 kg/m  Wt Readings from Last 3 Encounters:  01/12/22 159 lb 1.3 oz (72.2 kg)  01/10/22 160 lb (72.6 kg)  08/31/21 161 lb 12.8 oz (73.4 kg)    Lab Results  Component Value Date   TSH 1.150 07/29/2021   Lab Results  Component Value Date   WBC 16.5 (H) 01/10/2022   HGB 14.5 01/10/2022   HCT 40.8 01/10/2022   MCV 91.1 01/10/2022   PLT 226 01/10/2022   Lab Results  Component Value Date   NA 138 01/10/2022   K 3.8 01/10/2022   CO2 25 01/10/2022   GLUCOSE 105 (H) 01/10/2022   BUN 17 01/10/2022   CREATININE 0.90 01/10/2022   BILITOT 1.8 (H) 01/10/2022   ALKPHOS 41 01/10/2022   AST 27 01/10/2022   ALT 22 01/10/2022   PROT 7.0 01/10/2022   ALBUMIN 4.2 01/10/2022   CALCIUM 9.0  01/10/2022   ANIONGAP 4 (L) 01/10/2022   EGFR 96 07/29/2021   Lab Results  Component Value Date   CHOL 187 07/29/2021   Lab Results  Component Value Date  HDL 42 07/29/2021   Lab Results  Component Value Date   LDLCALC 119 (H) 07/29/2021   Lab Results  Component Value Date   TRIG 143 07/29/2021   Lab Results  Component Value Date   CHOLHDL 4.5 07/29/2021   Lab Results  Component Value Date   HGBA1C 5.5 07/29/2021      Assessment & Plan:  GAD (generalized anxiety disorder) Assessment & Plan: Reports history of anxiety and PTSD since childhood GAD-7 is 61 We will start patient on Zoloft 25 mg today Informed that it may take at least 2 weeks for benefit in 6 to 8 weeks for full effect Referral placed to psychiatry We will follow-up in 4 weeks  Orders: -     Sertraline HCl; Take 1 tablet (25 mg total) by mouth daily.  Dispense: 30 tablet; Refill: 1  LUQ pain Assessment & Plan: Low suspicion of pancreatitis Refilled the calamine 20 mg and omeprazole 40 mg Encourage patient to resume treatment regimen Encourage patient to follow-up with GI, as he reports missing his last appointment   Gastroesophageal reflux disease without esophagitis Assessment & Plan: Refilled omeprazole 40 mg daily Encourage adherence to treatment regiment  Orders: -     Omeprazole; Take 1 capsule (40 mg total) by mouth daily as needed.  Dispense: 30 capsule; Refill: 1  Tobacco use Assessment & Plan: Current smoker He reports quitting marijuana, but smokes 1 pack of cigarettes daily Smoking cessation encouraged   Screening for lead poisoning -     Lead, blood (adult age 90 yrs or greater)  Irritable bowel syndrome with diarrhea -     Dicyclomine HCl; Take 1 tablet (20 mg total) by mouth in the morning, at noon, in the evening, and at bedtime.  Dispense: 60 tablet; Refill: 2    Follow-up: Return in about 4 weeks (around 02/09/2022), or ANXIETY AND PTSD.   Alvira Monday,  FNP

## 2022-01-12 NOTE — Assessment & Plan Note (Signed)
Refilled omeprazole 40 mg daily Encourage adherence to treatment regiment

## 2022-01-12 NOTE — Assessment & Plan Note (Signed)
Current smoker He reports quitting marijuana, but smokes 1 pack of cigarettes daily Smoking cessation encouraged

## 2022-01-12 NOTE — Assessment & Plan Note (Signed)
Reports history of anxiety and PTSD since childhood GAD-7 is 56 We will start patient on Zoloft 25 mg today Informed that it may take at least 2 weeks for benefit in 6 to 8 weeks for full effect Referral placed to psychiatry We will follow-up in 4 weeks

## 2022-01-12 NOTE — Assessment & Plan Note (Signed)
Low suspicion of pancreatitis Refilled the calamine 20 mg and omeprazole 40 mg Encourage patient to resume treatment regimen Encourage patient to follow-up with GI, as he reports missing his last appointment

## 2022-01-12 NOTE — Patient Instructions (Addendum)
I appreciate the opportunity to provide care to you today!    Follow up:  1 months  Screening: lead poisoning   Please pick up your medication at the pharmacy and resume treatment I recommended smoking cessation Please follow-up with GI for ongoing symptoms of left lower quadrant abdominal pain Please start taking Zoloft 25 mg daily for anxiety and PTSD Please report worsening of your symptoms including depression anxiety and suicidal ideation while taking Zoloft 25 mg daily   Please avoid foods that can lead to acid reflux:  Avoid certain foods and drinks, such as coffee, chocolate, onions, peppermint, spicy foods, carbonated beverages, citrus fruits, tomatoes, onions, Garlic, alcohol, Fatty foods (bacon, burgers, sausages, steak, fried foods, dairy food)   Please continue to a heart-healthy diet and increase your physical activities. Try to exercise for at least three times a week.      It was a pleasure to see you and I look forward to continuing to work together on your health and well-being. Please do not hesitate to call the office if you need care or have questions about your care.   Have a wonderful day and week. With Gratitude, Gilmore Laroche MSN, FNP-BC

## 2022-01-15 LAB — LEAD, BLOOD (ADULT >= 16 YRS): Lead-Whole Blood: 1 ug/dL (ref 0.0–3.4)

## 2022-02-07 ENCOUNTER — Ambulatory Visit (INDEPENDENT_AMBULATORY_CARE_PROVIDER_SITE_OTHER): Payer: BC Managed Care – PPO | Admitting: Gastroenterology

## 2022-02-07 ENCOUNTER — Telehealth: Payer: Self-pay | Admitting: *Deleted

## 2022-02-07 ENCOUNTER — Encounter: Payer: Self-pay | Admitting: Gastroenterology

## 2022-02-07 VITALS — BP 125/78 | HR 92 | Temp 98.0°F | Ht 64.0 in | Wt 160.2 lb

## 2022-02-07 DIAGNOSIS — D72829 Elevated white blood cell count, unspecified: Secondary | ICD-10-CM | POA: Diagnosis not present

## 2022-02-07 DIAGNOSIS — R112 Nausea with vomiting, unspecified: Secondary | ICD-10-CM | POA: Diagnosis not present

## 2022-02-07 DIAGNOSIS — R1012 Left upper quadrant pain: Secondary | ICD-10-CM

## 2022-02-07 NOTE — Patient Instructions (Signed)
Continue to avoid marijuana.  CT scan of your abdomen as planned.  Complete labs at [facility].

## 2022-02-07 NOTE — Telephone Encounter (Signed)
[place] PAOrder ID: [id number]       Authorized  Approval Valid Through: 02/07/2022 - 04/07/2022  Pt informed CT is scheduled for 03/18/22, Friday at 5 pm, arrive at 4:45 pm, nothing to eat or drink 4 hours prior. Pt verbalized understanding.

## 2022-02-07 NOTE — Progress Notes (Signed)
GI Office Note    Referring Provider: Gilmore Laroche, FNP Primary Care Physician:  Gilmore Laroche, FNP  Primary Gastroenterologist: Roetta Sessions, MD   Chief Complaint   Chief Complaint  Patient presents with   Abdominal Pain    States that it wakes him up, feels like it keeps turning and turning. Pain mostly in the morning. Has stopped smoking marijuana.     History of Present Illness   Jacob Bradley is a 26 y.o. male presenting today for follow-up.  Last seen July 2023. History of GERD, IBS-D, possible cannabinoid hyperemesis syndrome, LUQ pain with N/V (for 3 years).   Wakes up 3-4am every morning with abdominal pain. Takes dicyclomine and omeprazole. Take shower feels better. Stomach churning, hurts. Does not feel like needs to have BM. Nausea not really an issue anymore. No heartburn.   Sometimes pain in the abdomen can be sharp and severe. He denies postprandial component. Often worse with movement. However, when pain is severe, he can't eat. Went to ED 01/10/22. That day he says he had a bulge in the left upper abdomen. States EMT noticed as well. He was frustrated because the ED provider would not order a CT scan to rule out hernia. Last severe episode of abdominal pain about 3-4 days ago.   He has changed diet, no longer eating soda, Monsters, no coffee. Eats mostly chicken and hot dogs. Drinks water, OJ, tea. Eats once per day, due to early satiety.   He cut back on marijuana use in July. Using 1-2 times per week. Quit completely about 3-4 weeks ago. Drinks shot of liquor once every 1-2 weeks. Smoking one ppd cigarettes.    Colonoscopy February 2022: Hemorrhoids found on perianal exam, diverticulosis, nonbleeding internal hemorrhoids.  Repeat colonoscopy at age 48 for screening purposes.   EGD February 2022: Several mucosal breaks in the esophagus, immediately distal to the Z-line with mild mucosal oozing.  Gastroesophageal flap valve classified as Hill grade 2  (fold present, opens with respiration), gastritis. Bx showed gastritis but no h.pylori.   Medications   Current Outpatient Medications  Medication Sig Dispense Refill   dicyclomine (BENTYL) 20 MG tablet Take 1 tablet (20 mg total) by mouth in the morning, at noon, in the evening, and at bedtime. 60 tablet 2   omeprazole (PRILOSEC) 40 MG capsule Take 1 capsule (40 mg total) by mouth daily as needed. 30 capsule 1   sertraline (ZOLOFT) 25 MG tablet Take 1 tablet (25 mg total) by mouth daily. 30 tablet 1   No current facility-administered medications for this visit.    Allergies   Allergies as of 02/07/2022   (No Known Allergies)     Review of Systems   General: Negative for anorexia, weight loss, fever, chills, fatigue, weakness. ENT: Negative for hoarseness, difficulty swallowing , nasal congestion. CV: Negative for chest pain, angina, palpitations, dyspnea on exertion, peripheral edema.  Respiratory: Negative for dyspnea at rest, dyspnea on exertion, cough, sputum, wheezing.  GI: See history of present illness. GU:  Negative for dysuria, hematuria, urinary incontinence, urinary frequency, nocturnal urination.  Endo: Negative for unusual weight change.     Physical Exam   BP 125/78 (BP Location: Right Arm, Patient Position: Sitting, Cuff Size: Large)   Pulse 92   Temp 98 F (36.7 C) (Oral)   Ht 5\' 4"  (1.626 m)   Wt 160 lb 3.2 oz (72.7 kg)   SpO2 96%   BMI 27.50 kg/m    General:  Well-nourished, well-developed in no acute distress.  Eyes: No icterus. Mouth: Oropharyngeal mucosa moist and pink   Abdomen: Bowel sounds are normal, nontender, nondistended, no hepatosplenomegaly or masses,  no abdominal bruits or hernia , no rebound or guarding.  Rectal: not performed Extremities: No lower extremity edema. No clubbing or deformities. Neuro: Alert and oriented x 4   Skin: Warm and dry, no jaundice.   Psych: Alert and cooperative, normal mood and affect.  Labs   Lab  Results  Component Value Date   CREATININE 0.90 01/10/2022   BUN 17 01/10/2022   NA 138 01/10/2022   K 3.8 01/10/2022   CL 109 01/10/2022   CO2 25 01/10/2022   Lab Results  Component Value Date   WBC 16.5 (H) 01/10/2022   HGB 14.5 01/10/2022   HCT 40.8 01/10/2022   MCV 91.1 01/10/2022   PLT 226 01/10/2022   Lab Results  Component Value Date   ALT 22 01/10/2022   AST 27 01/10/2022   ALKPHOS 41 01/10/2022   BILITOT 1.8 (H) 01/10/2022   Lab Results  Component Value Date   LIPASE 29 01/10/2022    Imaging Studies   No results found.  Assessment   LUQ pain: previously associated with nausea/vomiting but his has improved. He has daily abdominal pain but sometimes severe. Usually wakes up with pain. Usually not postprandial but when he is hurting he can't eat. He does have some early satiety but is maintaining weight. He continues to get relief with hot showers. He reports he is marijuana free for 3-4 weeks, hopefully symptoms of hyperemesis cannabinoid syndrome will soon be improving. Recently starting Zoloft and referral to psychiatry pending. If symptoms functional, should see some improvement as well. Recently appreciated bulging in LUQ. None seen on exam today. Doubt abdominal wall hernia. Due to persistent abdominal pain, consider update imaging.   GERD: typical symptoms improved.   IBS-D: improved.    PLAN    Avoid all marijuana.  CT Abd/pelvis Complete labs.   Leanna Battles. Melvyn Neth, MHS, PA-C [facility]

## 2022-02-10 DIAGNOSIS — R112 Nausea with vomiting, unspecified: Secondary | ICD-10-CM | POA: Diagnosis not present

## 2022-02-10 DIAGNOSIS — D72829 Elevated white blood cell count, unspecified: Secondary | ICD-10-CM | POA: Diagnosis not present

## 2022-02-10 DIAGNOSIS — R1012 Left upper quadrant pain: Secondary | ICD-10-CM | POA: Diagnosis not present

## 2022-02-11 ENCOUNTER — Encounter: Payer: Self-pay | Admitting: Family Medicine

## 2022-02-11 ENCOUNTER — Ambulatory Visit (INDEPENDENT_AMBULATORY_CARE_PROVIDER_SITE_OTHER): Payer: BC Managed Care – PPO | Admitting: Family Medicine

## 2022-02-11 VITALS — BP 112/75 | HR 87 | Ht 65.0 in | Wt 162.0 lb

## 2022-02-11 DIAGNOSIS — F411 Generalized anxiety disorder: Secondary | ICD-10-CM | POA: Diagnosis not present

## 2022-02-11 DIAGNOSIS — R1032 Left lower quadrant pain: Secondary | ICD-10-CM

## 2022-02-11 LAB — CBC WITH DIFFERENTIAL/PLATELET
Basophils Absolute: 0 10*3/uL (ref 0.0–0.2)
Basos: 0 %
EOS (ABSOLUTE): 0.1 10*3/uL (ref 0.0–0.4)
Eos: 1 %
Hematocrit: 43.4 % (ref 37.5–51.0)
Hemoglobin: 15.3 g/dL (ref 13.0–17.7)
Immature Grans (Abs): 0 10*3/uL (ref 0.0–0.1)
Immature Granulocytes: 0 %
Lymphocytes Absolute: 4.2 10*3/uL — ABNORMAL HIGH (ref 0.7–3.1)
Lymphs: 29 %
MCH: 32.1 pg (ref 26.6–33.0)
MCHC: 35.3 g/dL (ref 31.5–35.7)
MCV: 91 fL (ref 79–97)
Monocytes Absolute: 0.8 10*3/uL (ref 0.1–0.9)
Monocytes: 6 %
Neutrophils Absolute: 9 10*3/uL — ABNORMAL HIGH (ref 1.4–7.0)
Neutrophils: 64 %
Platelets: 277 10*3/uL (ref 150–450)
RBC: 4.77 x10E6/uL (ref 4.14–5.80)
RDW: 12.2 % (ref 11.6–15.4)
WBC: 14.2 10*3/uL — ABNORMAL HIGH (ref 3.4–10.8)

## 2022-02-11 LAB — HEPATIC FUNCTION PANEL
ALT: 15 IU/L (ref 0–44)
AST: 15 IU/L (ref 0–40)
Albumin: 4.8 g/dL (ref 4.3–5.2)
Alkaline Phosphatase: 65 IU/L (ref 44–121)
Bilirubin Total: 0.4 mg/dL (ref 0.0–1.2)
Bilirubin, Direct: 0.12 mg/dL (ref 0.00–0.40)
Total Protein: 7.2 g/dL (ref 6.0–8.5)

## 2022-02-11 LAB — LIPASE: Lipase: 26 U/L (ref 13–78)

## 2022-02-11 NOTE — Assessment & Plan Note (Signed)
He reports doing well with Zoloft 25 mg daily He denies suicidal thoughts and ideation Encouraged to continue treatment regimen

## 2022-02-11 NOTE — Progress Notes (Signed)
Established Patient Office Visit  Subjective:  Patient ID: Jacob Bradley, male    DOB: 10-16-1995  Age: 26 y.o. MRN: [id number]  CC:  Chief Complaint  Patient presents with   Anxiety    Patient is here for GAD f/u. States concentration has improved, no side effects or issues from medications.    Abdominal Pain    Patient is still having abdominal pain, is having an CT scan on 03/18/22.    HPI Jacob Bradley is a 26 y.o. male with past medical history of chronic abdominal pain, cyclical vomiting, persistent marijuana use, depression, bipolar, PTSD, irritable bowel syndrome, GERD,anxiety and abdominal pain presents for f/u of  chronic medical conditions. For the details of today's visit, please refer to the assessment and plan.       Past Medical History:  Diagnosis Date   Bipolar disorder ([place])    PTSD (post-traumatic stress disorder)     Past Surgical History:  Procedure Laterality Date   COLONOSCOPY     none     UPPER GASTROINTESTINAL ENDOSCOPY      Family History  Problem Relation Age of Onset   Cirrhosis Mother        etoh   Colon cancer Neg Hx    Inflammatory bowel disease Neg Hx    Celiac disease Neg Hx    Esophageal cancer Neg Hx    Pancreatic cancer Neg Hx    Stomach cancer Neg Hx     Social History   Socioeconomic History   Marital status: Married    Spouse name: Not on file   Number of children: 1   Years of education: Not on file   Highest education level: Not on file  Occupational History   Not on file  Tobacco Use   Smoking status: Every Day    Packs/day: 1.00    Years: 8.00    Total pack years: 8.00    Types: Cigarettes   Smokeless tobacco: Never  Vaping Use   Vaping Use: Never used  Substance and Sexual Activity   Alcohol use: Not Currently   Drug use: Not Currently    Frequency: 3.0 times per week    Types: Marijuana   Sexual activity: Yes  Other Topics Concern   Not on file  Social History Narrative   Not on file   Social  Determinants of Health   Financial Resource Strain: Not on file  Food Insecurity: Not on file  Transportation Needs: Not on file  Physical Activity: Not on file  Stress: Not on file  Social Connections: Not on file  Intimate Partner Violence: Not on file    Outpatient Medications Prior to Visit  Medication Sig Dispense Refill   dicyclomine (BENTYL) 20 MG tablet Take 1 tablet (20 mg total) by mouth in the morning, at noon, in the evening, and at bedtime. 60 tablet 2   omeprazole (PRILOSEC) 40 MG capsule Take 1 capsule (40 mg total) by mouth daily as needed. 30 capsule 1   sertraline (ZOLOFT) 25 MG tablet Take 1 tablet (25 mg total) by mouth daily. 30 tablet 1   No facility-administered medications prior to visit.    No Known Allergies  ROS Review of Systems  Constitutional:  Negative for fatigue and fever.  Eyes:  Negative for visual disturbance.  Respiratory:  Negative for chest tightness and shortness of breath.   Cardiovascular:  Negative for chest pain and palpitations.  Neurological:  Negative for dizziness and headaches.  Objective:    Physical Exam HENT:     Head: Normocephalic.     Right Ear: External ear normal.     Left Ear: External ear normal.     Nose: No congestion or rhinorrhea.     Mouth/Throat:     Mouth: Mucous membranes are moist.  Cardiovascular:     Rate and Rhythm: Regular rhythm.     Heart sounds: No murmur heard. Pulmonary:     Effort: No respiratory distress.     Breath sounds: Normal breath sounds.  Neurological:     Mental Status: He is alert.     BP 112/75   Pulse 87   Ht _0  (1.651 m)   Wt 162 lb (73.5 kg)   SpO2 96%   BMI 26.96 kg/m  Wt Readings from Last 3 Encounters:  02/11/22 162 lb (73.5 kg)  02/07/22 160 lb 3.2 oz (72.7 kg)  01/12/22 159 lb 1.3 oz (72.2 kg)    Lab Results  Component Value Date   TSH 1.150 07/29/2021   Lab Results  Component Value Date   WBC 14.2 (H) 02/10/2022   HGB 15.3 02/10/2022   HCT  43.4 02/10/2022   MCV 91 02/10/2022   PLT 277 02/10/2022   Lab Results  Component Value Date   NA 138 01/10/2022   K 3.8 01/10/2022   CO2 25 01/10/2022   GLUCOSE 105 (H) 01/10/2022   BUN 17 01/10/2022   CREATININE 0.90 01/10/2022   BILITOT 0.4 02/10/2022   ALKPHOS 65 02/10/2022   AST 15 02/10/2022   ALT 15 02/10/2022   PROT 7.2 02/10/2022   ALBUMIN 4.8 02/10/2022   CALCIUM 9.0 01/10/2022   ANIONGAP 4 (L) 01/10/2022   EGFR 96 07/29/2021   Lab Results  Component Value Date   CHOL 187 07/29/2021   Lab Results  Component Value Date   HDL 42 07/29/2021   Lab Results  Component Value Date   LDLCALC 119 (H) 07/29/2021   Lab Results  Component Value Date   TRIG 143 07/29/2021   Lab Results  Component Value Date   CHOLHDL 4.5 07/29/2021   Lab Results  Component Value Date   HGBA1C 5.5 07/29/2021      Assessment & Plan:  GAD (generalized anxiety disorder) Assessment & Plan: He reports doing well with Zoloft 25 mg daily He denies suicidal thoughts and ideation Encouraged to continue treatment regimen   Left lower quadrant abdominal pain Assessment & Plan: Chronic left-sided abdominal pain that has been persistent for the past 2 to 2-1/2 years He reports having a scheduled CT scan on 03/18/2022 Encouraged to continue to follow-up with GI as scheduled       Follow-up: Return in about 3 months (around 05/13/2022).   Alvira Monday, FNP

## 2022-02-11 NOTE — Patient Instructions (Addendum)
I appreciate the opportunity to provide care to you today!    Follow up:  3 months      Please continue to a heart-healthy diet and increase your physical activities.   Physical activity helps: Lower your blood glucose, improve your heart health, lower your blood pressure and cholesterol, burn calories to help manage her weight, gave you energy, lower stress, and improve his sleep.  The American diabetes Association (ADA) recommends being active for 2-1/2 hours (150 minutes) or more week.  Exercise for 30 minutes, 5 days a week (150 minutes total)    It was a pleasure to see you and I look forward to continuing to work together on your health and well-being. Please do not hesitate to call the office if you need care or have questions about your care.   Have a wonderful day and week. With Gratitude, Gilmore Laroche MSN, FNP-BC

## 2022-02-11 NOTE — Assessment & Plan Note (Signed)
Chronic left-sided abdominal pain that has been persistent for the past 2 to 2-1/2 years He reports having a scheduled CT scan on 03/18/2022 Encouraged to continue to follow-up with GI as scheduled

## 2022-03-16 ENCOUNTER — Other Ambulatory Visit: Payer: Self-pay | Admitting: Family Medicine

## 2022-03-16 DIAGNOSIS — K219 Gastro-esophageal reflux disease without esophagitis: Secondary | ICD-10-CM

## 2022-03-16 DIAGNOSIS — F411 Generalized anxiety disorder: Secondary | ICD-10-CM

## 2022-03-18 ENCOUNTER — Telehealth: Payer: Self-pay | Admitting: *Deleted

## 2022-03-18 ENCOUNTER — Ambulatory Visit (HOSPITAL_COMMUNITY): Payer: Medicaid Other

## 2022-03-18 NOTE — Telephone Encounter (Signed)
Pt called in. His CT for today was cancelled bc he has [place] medicaid. Needs updated PA

## 2022-03-21 NOTE — Telephone Encounter (Signed)
Order ID: [id number]   Approval Valid Through: 03/21/2022 - 05/19/2022 ---  Called pt and aware of approval. He would like CT sooner than he is scheduled for. Advised I can get sooner at diff location in [place]. He was fine with that.  Called pt back and made aware CT scheduled for [facility] location on  2/1, arrival 6:45pm, npo 4 hrs prior. He voiced understanding

## 2022-03-24 ENCOUNTER — Ambulatory Visit (HOSPITAL_BASED_OUTPATIENT_CLINIC_OR_DEPARTMENT_OTHER)
Admission: RE | Admit: 2022-03-24 | Discharge: 2022-03-24 | Disposition: A | Discharge status: Home / Self Care | Payer: Commercial Managed Care - HMO | Source: Ambulatory Visit | Attending: Gastroenterology | Admitting: Gastroenterology

## 2022-03-24 DIAGNOSIS — D72829 Elevated white blood cell count, unspecified: Secondary | ICD-10-CM | POA: Diagnosis present

## 2022-03-24 DIAGNOSIS — R112 Nausea with vomiting, unspecified: Secondary | ICD-10-CM | POA: Insufficient documentation

## 2022-03-24 DIAGNOSIS — R1012 Left upper quadrant pain: Secondary | ICD-10-CM | POA: Insufficient documentation

## 2022-03-24 MED ORDER — IOHEXOL 300 MG/ML  SOLN
100.0000 mL | Freq: Once | INTRAMUSCULAR | Status: AC | PRN
  Administered 2022-03-24: 100 mL via INTRAVENOUS

## 2022-03-29 ENCOUNTER — Other Ambulatory Visit (INDEPENDENT_AMBULATORY_CARE_PROVIDER_SITE_OTHER): Payer: Self-pay | Admitting: *Deleted

## 2022-03-29 DIAGNOSIS — D72829 Elevated white blood cell count, unspecified: Secondary | ICD-10-CM

## 2022-04-07 ENCOUNTER — Inpatient Hospital Stay: Payer: Medicaid Other | Admitting: Hematology

## 2022-04-14 ENCOUNTER — Encounter: Payer: Medicaid Other | Admitting: Hematology

## 2022-04-21 NOTE — Progress Notes (Shared)
[place], [place] [address]   Clinic Day:  04/21/2022  Referring physician: Mahala Menghini, PA-C  Patient Care Team: Alvira Monday, [place] as PCP - General (Family Medicine)   ASSESSMENT & PLAN:   Assessment: ***  Plan: ***  No orders of the defined types were placed in this encounter.     Beverly Gust Oliver,acting as a scribe for Derek Jack, MD.,have documented all relevant documentation on the behalf of Derek Jack, MD,as directed by  Derek Jack, MD while in the presence of Derek Jack, MD.   ***  Alric Ran Andreas Ohm   2/29/202411:05 AM  CHIEF COMPLAINT/PURPOSE OF CONSULT:   Diagnosis: Leukocytosis   Current Therapy:  ***  HISTORY OF PRESENT ILLNESS:   Larence is a 27 y.o. male presenting to clinic today for evaluation of Leukocytosis  at the request of PA Neil Crouch.  Today, he states that he is doing well overall. His appetite level is at ***%. His energy level is at ***%.    PAST MEDICAL HISTORY:   Past Medical History: Past Medical History:  Diagnosis Date   Bipolar disorder ([place])    PTSD (post-traumatic stress disorder)     Surgical History: Past Surgical History:  Procedure Laterality Date   COLONOSCOPY     none     UPPER GASTROINTESTINAL ENDOSCOPY      Social History: Social History   Socioeconomic History   Marital status: Married    Spouse name: Not on file   Number of children: 1   Years of education: Not on file   Highest education level: Not on file  Occupational History   Not on file  Tobacco Use   Smoking status: Every Day    Packs/day: 1.00    Years: 8.00    Total pack years: 8.00    Types: Cigarettes   Smokeless tobacco: Never  Vaping Use   Vaping Use: Never used  Substance and Sexual Activity   Alcohol use: Not Currently   Drug use: Not Currently    Frequency: 3.0 times per week    Types: Marijuana   Sexual activity: Yes  Other Topics  Concern   Not on file  Social History Narrative   Not on file   Social Determinants of Health   Financial Resource Strain: Not on file  Food Insecurity: Not on file  Transportation Needs: Not on file  Physical Activity: Not on file  Stress: Not on file  Social Connections: Not on file  Intimate Partner Violence: Not on file    Family History: Family History  Problem Relation Age of Onset   Cirrhosis Mother        etoh   Colon cancer Neg Hx    Inflammatory bowel disease Neg Hx    Celiac disease Neg Hx    Esophageal cancer Neg Hx    Pancreatic cancer Neg Hx    Stomach cancer Neg Hx     Current Medications:  Current Outpatient Medications:    dicyclomine (BENTYL) 20 MG tablet, Take 1 tablet (20 mg total) by mouth in the morning, at noon, in the evening, and at bedtime., Disp: 60 tablet, Rfl: 2   omeprazole (PRILOSEC) 40 MG capsule, TAKE 1 CAPSULE BY MOUTH ONCE DAILY AS NEEDED, Disp: 30 capsule, Rfl: 0   sertraline (ZOLOFT) 25 MG tablet, Take 1 tablet by mouth once daily, Disp: 30 tablet, Rfl: 0   Allergies: No Known Allergies  REVIEW OF SYSTEMS:  Review of Systems - Oncology   VITALS:   There were no vitals taken for this visit.  Wt Readings from Last 3 Encounters:  02/11/22 162 lb (73.5 kg)  02/07/22 160 lb 3.2 oz (72.7 kg)  01/12/22 159 lb 1.3 oz (72.2 kg)    There is no height or weight on file to calculate BMI.   PHYSICAL EXAM:   Physical Exam  LABS:      Latest Ref Rng & Units 02/10/2022    2:21 PM 01/10/2022    6:25 PM 08/31/2021   10:16 AM  CBC  WBC 3.4 - 10.8 x10E3/uL 14.2  16.5  11.1   Hemoglobin 13.0 - 17.7 g/dL 15.3  14.5  16.3   Hematocrit 37.5 - 51.0 % 43.4  40.8  46.6   Platelets 150 - 450 x10E3/uL 277  226  233       Latest Ref Rng & Units 02/10/2022    2:21 PM 01/10/2022    6:25 PM 08/31/2021   10:16 AM  CMP  Glucose 70 - 99 mg/dL  105    BUN 6 - 20 mg/dL  17    Creatinine 0.61 - 1.24 mg/dL  0.90    Sodium 135 - 145 mmol/L   138    Potassium 3.5 - 5.1 mmol/L  3.8    Chloride 98 - 111 mmol/L  109    CO2 22 - 32 mmol/L  25    Calcium 8.9 - 10.3 mg/dL  9.0    Total Protein 6.0 - 8.5 g/dL 7.2  7.0  6.4   Total Bilirubin 0.0 - 1.2 mg/dL 0.4  1.8  0.6   Alkaline Phos 44 - 121 IU/L 65  41  48   AST 0 - 40 IU/L '15  27  21   '$ ALT 0 - 44 IU/L '15  22  17      '$ No results found for: "CEA1", "CEA" / No results found for: "CEA1", "CEA" No results found for: "PSA1" No results found for: "[id number]" No results found for: "CAN125"  No results found for: "TOTALPROTELP", "ALBUMINELP", "A1GS", "A2GS", "BETS", "BETA2SER", "GAMS", "MSPIKE", "SPEI" No results found for: "TIBC", "FERRITIN", "IRONPCTSAT" No results found for: "LDH"   STUDIES:   CT ABDOMEN PELVIS W CONTRAST  Result Date: 03/24/2022 CLINICAL DATA:  Chronic left-sided abdominal pain and vomiting. Crohn disease. EXAM: CT ABDOMEN AND PELVIS WITH CONTRAST TECHNIQUE: Multidetector CT imaging of the abdomen and pelvis was performed using the standard protocol following bolus administration of intravenous contrast. RADIATION DOSE REDUCTION: This exam was performed according to the departmental dose-optimization program which includes automated exposure control, adjustment of the mA and/or kV according to patient size and/or use of iterative reconstruction technique. CONTRAST:  126m OMNIPAQUE IOHEXOL 300 MG/ML  SOLN COMPARISON:  05/15/2020 FINDINGS: Lower Chest: No acute findings. Hepatobiliary: No hepatic masses identified. Gallbladder is unremarkable. No evidence of biliary ductal dilatation. Pancreas:  No mass or inflammatory changes. Spleen: Within normal limits in size and appearance. Adrenals/Urinary Tract: No suspicious masses identified. No evidence of ureteral calculi or hydronephrosis. Stomach/Bowel: No evidence of obstruction, inflammatory process or abnormal fluid collections. Normal appendix visualized. Vascular/Lymphatic: No pathologically enlarged lymph nodes. No acute  vascular findings. Reproductive:  No mass or other significant abnormality. Other:  None. Musculoskeletal:  No suspicious bone lesions identified. IMPRESSION: Negative. No acute findings or other significant abnormality. Electronically Signed   By: JMarlaine HindM.D.   On: 03/24/2022 23:17

## 2022-04-22 ENCOUNTER — Inpatient Hospital Stay: Payer: Commercial Managed Care - HMO | Attending: Hematology | Admitting: Hematology

## 2022-04-22 ENCOUNTER — Inpatient Hospital Stay: Payer: Medicaid Other

## 2022-04-22 VITALS — BP 138/86 | HR 64 | Temp 98.6°F | Resp 18 | Ht 65.0 in | Wt 156.6 lb

## 2022-04-22 DIAGNOSIS — R634 Abnormal weight loss: Secondary | ICD-10-CM | POA: Diagnosis not present

## 2022-04-22 DIAGNOSIS — Z79899 Other long term (current) drug therapy: Secondary | ICD-10-CM | POA: Insufficient documentation

## 2022-04-22 DIAGNOSIS — R61 Generalized hyperhidrosis: Secondary | ICD-10-CM | POA: Insufficient documentation

## 2022-04-22 DIAGNOSIS — D72829 Elevated white blood cell count, unspecified: Secondary | ICD-10-CM

## 2022-04-22 DIAGNOSIS — F32A Depression, unspecified: Secondary | ICD-10-CM | POA: Diagnosis not present

## 2022-04-22 DIAGNOSIS — F17210 Nicotine dependence, cigarettes, uncomplicated: Secondary | ICD-10-CM | POA: Insufficient documentation

## 2022-04-22 DIAGNOSIS — R1012 Left upper quadrant pain: Secondary | ICD-10-CM | POA: Diagnosis not present

## 2022-04-22 DIAGNOSIS — F419 Anxiety disorder, unspecified: Secondary | ICD-10-CM | POA: Diagnosis not present

## 2022-04-22 DIAGNOSIS — R21 Rash and other nonspecific skin eruption: Secondary | ICD-10-CM | POA: Diagnosis not present

## 2022-04-22 LAB — LACTATE DEHYDROGENASE: LDH: 135 U/L (ref 98–192)

## 2022-04-22 LAB — CBC WITH DIFFERENTIAL/PLATELET
Abs Immature Granulocytes: 0.03 10*3/uL (ref 0.00–0.07)
Basophils Absolute: 0 10*3/uL (ref 0.0–0.1)
Basophils Relative: 0 %
Eosinophils Absolute: 0.4 10*3/uL (ref 0.0–0.5)
Eosinophils Relative: 3 %
HCT: 43.1 % (ref 39.0–52.0)
Hemoglobin: 15.1 g/dL (ref 13.0–17.0)
Immature Granulocytes: 0 %
Lymphocytes Relative: 38 %
Lymphs Abs: 4.8 10*3/uL — ABNORMAL HIGH (ref 0.7–4.0)
MCH: 32.1 pg (ref 26.0–34.0)
MCHC: 35 g/dL (ref 30.0–36.0)
MCV: 91.5 fL (ref 80.0–100.0)
Monocytes Absolute: 0.9 10*3/uL (ref 0.1–1.0)
Monocytes Relative: 7 %
Neutro Abs: 6.4 10*3/uL (ref 1.7–7.7)
Neutrophils Relative %: 52 %
Platelets: 241 10*3/uL (ref 150–400)
RBC: 4.71 MIL/uL (ref 4.22–5.81)
RDW: 12.2 % (ref 11.5–15.5)
WBC: 12.6 10*3/uL — ABNORMAL HIGH (ref 4.0–10.5)
nRBC: 0 % (ref 0.0–0.2)

## 2022-04-22 NOTE — Progress Notes (Deleted)
[facility] [address], [place] [address]   Clinic Day:  04/22/2022  Referring physician: Mahala Menghini, PA-C  Patient Care Team: Alvira Monday, [place] as PCP - General (Family Medicine) Derek Jack, MD as Medical Oncologist (Hematology)   ASSESSMENT & PLAN:   Assessment:  1.  Leukocytosis: - Patient seen at the request of Neil Crouch, PA-C. - CBC (02/10/2022): WBC 14.2, ANC 9K, ALC 4.2K - Mildly elevated white count since February 2018. - CTAP (03/24/2022): Normal spleen.  No acute findings. - Weight loss 94 pounds in the last 2 years.  Decreased appetite and feels up easily.  Reports left upper quadrant pain for the last 2 to 3 years, worsening lately.  Pain starts when he has a bowel movement.  It gets better when he takes a hot shower. - Also reports drenching night sweats, every night for more than a year.  No evening fevers.  No infections.  No oral steroids. - He uses cortisone cream approximately once per week on the right leg for rash and itching.  2.  Social/family history: - He lives at home with his wife.  He has not been able to work for the last 2 to 3 years due to abdominal pain.  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 - He is current active smoker, smokes 1 pack/day for the last 1 year.  Prior to that he smoked 2 packs/day for 14 years. - Family history significant for Crohn's disease and multiple members.  No history of malignancy.  Plan:  1.  Leukocytosis: - We talked about reactive leukocytosis and clonal causes. - He had a lymphocytic leukocytosis on the last 2 occasions and also had neutrophilic leukocytosis couple of occasions. - Due to his significant weight loss and night sweats, I will check flow cytometry as well as workup for myeloproliferative neoplasms including JAK2 V617F and BCR/ABL by FISH. - RTC 4 weeks to discuss further plan.  2.  Anxiety: - He reports being on Zoloft 25 mg daily.   He reports it is poorly controlled.  He will talk to his primary doctor for better control.   Orders Placed This Encounter  Procedures   CBC with Differential    Standing Status:   Future    Number of Occurrences:   1    Standing Expiration Date:   04/22/2023   Lactate dehydrogenase    Standing Status:   Future    Number of Occurrences:   1    Standing Expiration Date:   04/22/2023   Flow Cytometry, Peripheral Blood (Oncology)    Standing Status:   Future    Number of Occurrences:   1    Standing Expiration Date:   04/22/2023   BCR-ABL1 FISH    Standing Status:   Future    Number of Occurrences:   1    Standing Expiration Date:   04/22/2023   JAK2 V617F rfx CALR/MPL/E12-15    Standing Status:   Future    Number of Occurrences:   1    Standing Expiration Date:   04/22/2023      I,Alexis Herring,acting as a scribe for [employer], MD.,have documented all relevant documentation on the behalf of Derek Jack, MD,as directed by  Derek Jack, MD while in the presence of Derek Jack, MD.   I, Derek Jack MD, have reviewed the above documentation for accuracy and completeness, and I agree with the above.  Derek Jack, MD   3/1/202412:45 PM  CHIEF COMPLAINT/PURPOSE OF CONSULT:   Diagnosis: Leukocytosis  Current Therapy: Under workup  HISTORY OF PRESENT ILLNESS:   Jacob Bradley is a 27 y.o. male presenting to clinic today for evaluation of leukocytosis at the request of GI Dr. Neil Crouch.  His most recent WBC count was 14.2 on 02/10/22 with elevated neutrophil count and lymphocyte count.  He reports having left upper quadrant abdominal pain for the last 2 to 3 years, which has been worsening lately.  Reports weight loss of 94 pounds in the last 2 years.  Denies any fevers.  Reports drenching night sweats every night for the last 1 year.  Reports decreased appetite and feels up easily.  He does not take any oral steroids.  He uses cortisone cream  once per week for the right leg itching and rash.  Today, he states that he is doing well overall. His appetite level is at 0%. His energy level is at 0%.  He had a history of rectal bleeding in the past but nothing in the last 2 years. His last colonoscopy was 03/25/20, showing hemorrhoids, multiple small and large mouth diverticula in the sigmoid colon, descending colon, ascending colon and cecum.  Nonbleeding internal hemorrhoids were seen small and grade 2.    PAST MEDICAL HISTORY:   Past Medical History: Past Medical History:  Diagnosis Date   Bipolar disorder ([place])    PTSD (post-traumatic stress disorder)     Surgical History: Past Surgical History:  Procedure Laterality Date   COLONOSCOPY     none     UPPER GASTROINTESTINAL ENDOSCOPY      Social History: Social History   Socioeconomic History   Marital status: Married    Spouse name: Not on file   Number of children: 1   Years of education: Not on file   Highest education level: Not on file  Occupational History   Not on file  Tobacco Use   Smoking status: Every Day    Packs/day: 1.00    Years: 8.00    Total pack years: 8.00    Types: Cigarettes   Smokeless tobacco: Never  Vaping Use   Vaping Use: Never used  Substance and Sexual Activity   Alcohol use: Not Currently   Drug use: Not Currently    Frequency: 3.0 times per week    Types: Marijuana   Sexual activity: Yes  Other Topics Concern   Not on file  Social History Narrative   Not on file   Social Determinants of Health   Financial Resource Strain: Not on file  Food Insecurity: Food Insecurity Present (04/22/2022)   Hunger Vital Sign    Worried About Running Out of Food in the Last Year: Never true    Ran Out of Food in the Last Year: Sometimes true  Transportation Needs: No Transportation Needs (04/22/2022)   PRAPARE - Hydrologist (Medical): No    Lack of Transportation (Non-Medical): No  Physical Activity: Not on  file  Stress: Not on file  Social Connections: Not on file  Intimate Partner Violence: Not At Risk (04/22/2022)   Humiliation, Afraid, Rape, and Kick questionnaire    Fear of Current or Ex-Partner: No    Emotionally Abused: No    Physically Abused: No    Sexually Abused: No    Family History: Family History  Problem Relation Age of Onset   Cirrhosis Mother        etoh  Colon cancer Neg Hx    Inflammatory bowel disease Neg Hx    Celiac disease Neg Hx    Esophageal cancer Neg Hx    Pancreatic cancer Neg Hx    Stomach cancer Neg Hx     Current Medications:  Current Outpatient Medications:    dicyclomine (BENTYL) 20 MG tablet, Take 1 tablet (20 mg total) by mouth in the morning, at noon, in the evening, and at bedtime., Disp: 60 tablet, Rfl: 2   omeprazole (PRILOSEC) 40 MG capsule, TAKE 1 CAPSULE BY MOUTH ONCE DAILY AS NEEDED, Disp: 30 capsule, Rfl: 0   sertraline (ZOLOFT) 25 MG tablet, Take 1 tablet by mouth once daily, Disp: 30 tablet, Rfl: 0   Allergies: No Known Allergies  REVIEW OF SYSTEMS:   Review of Systems  Respiratory:  Positive for cough and shortness of breath.   Gastrointestinal:  Positive for abdominal pain, nausea and vomiting.  Psychiatric/Behavioral:  Positive for depression. The patient is nervous/anxious.   All other systems reviewed and are negative.    VITALS:   Blood pressure 138/86, pulse 64, temperature 98.6 F (37 C), temperature source Oral, resp. rate 18, height '5\' 5"'$  (1.651 m), weight 156 lb 9.6 oz (71 kg), SpO2 98 %.  Wt Readings from Last 3 Encounters:  04/22/22 156 lb 9.6 oz (71 kg)  02/11/22 162 lb (73.5 kg)  02/07/22 160 lb 3.2 oz (72.7 kg)    Body mass index is 26.06 kg/m.   PHYSICAL EXAM:   Physical Exam Vitals reviewed.  Constitutional:      Appearance: Normal appearance.  Cardiovascular:     Rate and Rhythm: Normal rate and regular rhythm.     Pulses: Normal pulses.     Heart sounds: Normal heart sounds.  Pulmonary:      Effort: Pulmonary effort is normal.     Breath sounds: Normal breath sounds.  Abdominal:     Palpations: Abdomen is soft.     Comments: Tenderness in the left upper quadrant with no mass palpable.  Musculoskeletal:     Right lower leg: No edema.     Left lower leg: No edema.  Skin:    Comments: Mild erythematous scaling patch on the right lateral leg and medial leg.  Neurological:     Mental Status: He is alert and oriented to person, place, and time.  Psychiatric:        Mood and Affect: Mood normal.        Behavior: Behavior normal.     LABS:      Latest Ref Rng & Units 02/10/2022    2:21 PM 01/10/2022    6:25 PM 08/31/2021   10:16 AM  CBC  WBC 3.4 - 10.8 x10E3/uL 14.2  16.5  11.1   Hemoglobin 13.0 - 17.7 g/dL 15.3  14.5  16.3   Hematocrit 37.5 - 51.0 % 43.4  40.8  46.6   Platelets 150 - 450 x10E3/uL 277  226  233       Latest Ref Rng & Units 02/10/2022    2:21 PM 01/10/2022    6:25 PM 08/31/2021   10:16 AM  CMP  Glucose 70 - 99 mg/dL  105    BUN 6 - 20 mg/dL  17    Creatinine 0.61 - 1.24 mg/dL  0.90    Sodium 135 - 145 mmol/L  138    Potassium 3.5 - 5.1 mmol/L  3.8    Chloride 98 - 111 mmol/L  109  CO2 22 - 32 mmol/L  25    Calcium 8.9 - 10.3 mg/dL  9.0    Total Protein 6.0 - 8.5 g/dL 7.2  7.0  6.4   Total Bilirubin 0.0 - 1.2 mg/dL 0.4  1.8  0.6   Alkaline Phos 44 - 121 IU/L 65  41  48   AST 0 - 40 IU/L '15  27  21   '$ ALT 0 - 44 IU/L '15  22  17      '$ No results found for: "CEA1", "CEA" / No results found for: "CEA1", "CEA" No results found for: "PSA1" No results found for: "[id number]" No results found for: "CAN125"  No results found for: "TOTALPROTELP", "ALBUMINELP", "A1GS", "A2GS", "BETS", "BETA2SER", "GAMS", "MSPIKE", "SPEI" No results found for: "TIBC", "FERRITIN", "IRONPCTSAT" No results found for: "LDH"   STUDIES:    Colonoscopy 04/14/2020 - Hemorrhoids found on perianal exam. - Diverticulosis in the sigmoid colon, in the descending colon, in the  ascending colon and in the cecum.- Non-bleeding internal hemorrhoids. - Normal mucosa in the entire examined colon. - The examined portion of the ileum was normal. - No specimens collected.   CT ABDOMEN PELVIS W CONTRAST  Result Date: 03/24/2022 CLINICAL DATA:  Chronic left-sided abdominal pain and vomiting. Crohn disease. EXAM: CT ABDOMEN AND PELVIS WITH CONTRAST TECHNIQUE: Multidetector CT imaging of the abdomen and pelvis was performed using the standard protocol following bolus administration of intravenous contrast. RADIATION DOSE REDUCTION: This exam was performed according to the departmental dose-optimization program which includes automated exposure control, adjustment of the mA and/or kV according to patient size and/or use of iterative reconstruction technique. CONTRAST:  152m OMNIPAQUE IOHEXOL 300 MG/ML  SOLN COMPARISON:  05/15/2020 FINDINGS: Lower Chest: No acute findings. Hepatobiliary: No hepatic masses identified. Gallbladder is unremarkable. No evidence of biliary ductal dilatation. Pancreas:  No mass or inflammatory changes. Spleen: Within normal limits in size and appearance. Adrenals/Urinary Tract: No suspicious masses identified. No evidence of ureteral calculi or hydronephrosis. Stomach/Bowel: No evidence of obstruction, inflammatory process or abnormal fluid collections. Normal appendix visualized. Vascular/Lymphatic: No pathologically enlarged lymph nodes. No acute vascular findings. Reproductive:  No mass or other significant abnormality. Other:  None. Musculoskeletal:  No suspicious bone lesions identified. IMPRESSION: Negative. No acute findings or other significant abnormality. Electronically Signed   By: JMarlaine HindM.D.   On: 03/24/2022 23:17

## 2022-04-22 NOTE — Patient Instructions (Addendum)
[facility]  Discharge Instructions  You were seen and examined today by Dr. Delton Coombes. Dr. Delton Coombes is a hematologist, meaning that he specializes in blood abnormalities. Dr. Delton Coombes discussed your past medical history, family history of cancers/blood conditions and the events that led to you being here today.  You were referred to Dr. Delton Coombes due to elevated white blood cell count.  Dr. Delton Coombes has recommended additional labs today for further evaluation.  Follow-up as scheduled.  Thank you for choosing [facility] to provide your oncology and hematology care.   To afford each patient quality time with our provider, please arrive at least 15 minutes before your scheduled appointment time. You may need to reschedule your appointment if you arrive late (10 or more minutes). Arriving late affects you and other patients whose appointments are after yours.  Also, if you miss three or more appointments without notifying the office, you may be dismissed from the clinic at the provider's discretion.    Again, thank you for choosing [facility].  Our hope is that these requests will decrease the amount of time that you wait before being seen by our physicians.   If you have a lab appointment with the [facility] please come in thru the Main Entrance and check in at the main information desk.           _____________________________________________________________  Should you have questions after your visit to [facility], please contact our office at [phone] and follow the prompts.  Our office hours are 8:00 a.m. to 4:30 p.m. Monday - Thursday and 8:00 a.m. to 2:30 p.m. Friday.  Please note that voicemails left after 4:00 p.m. may not be returned until the following business day.  We are closed weekends and all major holidays.  You do have access to a nurse 24-7, just call the main number to the clinic  [phone] and do not press any options, hold on the line and a nurse will answer the phone.    For prescription refill requests, have your pharmacy contact our office and allow 72 hours.    Masks are optional in the cancer centers. If you would like for your care team to wear a mask while they are taking care of you, please let them know. You may have one support person who is at least 27 years old accompany you for your appointments.

## 2022-04-26 NOTE — Progress Notes (Signed)
[facility] [address] [place] [address]   Clinic Day:  04/26/2022  Referring physician: Mahala Menghini, PA-C  Patient Care Team: Alvira Monday, [place] as PCP - General (Family Medicine) Derek Jack, MD as Medical Oncologist (Hematology)   ASSESSMENT & PLAN:   Assessment:  1.  Leukocytosis: - Patient seen at the request of Neil Crouch, PA-C. - CBC (02/10/2022): WBC 14.2, ANC 9K, ALC 4.2K - Mildly elevated white count since February 2018. - CTAP (03/24/2022): Normal spleen.  No acute findings. - Weight loss 94 pounds in the last 2 years.  Decreased appetite and feels up easily.  Reports left upper quadrant pain for the last 2 to 3 years, worsening lately.  Pain starts when he has a bowel movement.  It gets better when he takes a hot shower. - Also reports drenching night sweats, every night for more than a year.  No evening fevers.  No infections.  No oral steroids. - He uses cortisone cream approximately once per week on the right leg for rash and itching.  2.  Social/family history: - He lives at home with his wife.  He has not been able to work for the last 2 to 3 years due to abdominal pain.  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 - He is current active smoker, smokes 1 pack/day for the last 1 year.  Prior to that he smoked 2 packs/day for 14 years. - Family history significant for Crohn's disease and multiple members.  No history of malignancy.  Plan:  1.  Leukocytosis: - We talked about reactive leukocytosis and clonal causes. - He had a lymphocytic leukocytosis on the last 2 occasions and also had neutrophilic leukocytosis couple of occasions. - Due to his significant weight loss and night sweats, I will check flow cytometry as well as workup for myeloproliferative neoplasms including JAK2 V617F and BCR/ABL by FISH. - RTC 4 weeks to discuss further plan.  2.  Anxiety: - He reports being on Zoloft 25 mg daily.   He reports it is poorly controlled.  He will talk to his primary doctor for better control.   Orders Placed This Encounter  Procedures   CBC with Differential    Standing Status:   Future    Number of Occurrences:   1    Standing Expiration Date:   04/22/2023   Lactate dehydrogenase    Standing Status:   Future    Number of Occurrences:   1    Standing Expiration Date:   04/22/2023   Flow Cytometry, Peripheral Blood (Oncology)    Standing Status:   Future    Number of Occurrences:   1    Standing Expiration Date:   04/22/2023   BCR-ABL1 FISH    Standing Status:   Future    Number of Occurrences:   1    Standing Expiration Date:   04/22/2023   JAK2 V617F rfx CALR/MPL/E12-15    Standing Status:   Future    Number of Occurrences:   1    Standing Expiration Date:   04/22/2023      I,Alexis Herring,acting as a scribe for [employer], MD.,have documented all relevant documentation on the behalf of Derek Jack, MD,as directed by  Derek Jack, MD while in the presence of Derek Jack, MD.   I, Derek Jack MD, have reviewed the above documentation for accuracy and completeness, and I agree with the above.  Alexis Herring   3/5/20248:13 PM  CHIEF COMPLAINT/PURPOSE OF CONSULT:   Diagnosis: Leukocytosis  Current Therapy: Under workup  HISTORY OF PRESENT ILLNESS:   Jacob Bradley is a 27 y.o. male presenting to clinic today for evaluation of leukocytosis at the request of GI Dr. Neil Crouch.  His most recent WBC count was 14.2 on 02/10/22 with elevated neutrophil count and lymphocyte count.  He reports having left upper quadrant abdominal pain for the last 2 to 3 years, which has been worsening lately.  Reports weight loss of 94 pounds in the last 2 years.  Denies any fevers.  Reports drenching night sweats every night for the last 1 year.  Reports decreased appetite and feels up easily.  He does not take any oral steroids.  He uses cortisone cream once per  week for the right leg itching and rash.  Today, he states that he is doing well overall. His appetite level is at 0%. His energy level is at 0%.  He had a history of rectal bleeding in the past but nothing in the last 2 years. His last colonoscopy was 03/25/20, showing hemorrhoids, multiple small and large mouth diverticula in the sigmoid colon, descending colon, ascending colon and cecum.  Nonbleeding internal hemorrhoids were seen small and grade 2.    PAST MEDICAL HISTORY:   Past Medical History: Past Medical History:  Diagnosis Date   Bipolar disorder ([place])    PTSD (post-traumatic stress disorder)     Surgical History: Past Surgical History:  Procedure Laterality Date   COLONOSCOPY     none     UPPER GASTROINTESTINAL ENDOSCOPY      Social History: Social History   Socioeconomic History   Marital status: Married    Spouse name: Not on file   Number of children: 1   Years of education: Not on file   Highest education level: Not on file  Occupational History   Not on file  Tobacco Use   Smoking status: Every Day    Packs/day: 1.00    Years: 8.00    Total pack years: 8.00    Types: Cigarettes   Smokeless tobacco: Never  Vaping Use   Vaping Use: Never used  Substance and Sexual Activity   Alcohol use: Not Currently   Drug use: Not Currently    Frequency: 3.0 times per week    Types: Marijuana   Sexual activity: Yes  Other Topics Concern   Not on file  Social History Narrative   Not on file   Social Determinants of Health   Financial Resource Strain: Not on file  Food Insecurity: Food Insecurity Present (04/22/2022)   Hunger Vital Sign    Worried About Running Out of Food in the Last Year: Never true    Ran Out of Food in the Last Year: Sometimes true  Transportation Needs: No Transportation Needs (04/22/2022)   PRAPARE - Hydrologist (Medical): No    Lack of Transportation (Non-Medical): No  Physical Activity: Not on file   Stress: Not on file  Social Connections: Not on file  Intimate Partner Violence: Not At Risk (04/22/2022)   Humiliation, Afraid, Rape, and Kick questionnaire    Fear of Current or Ex-Partner: No    Emotionally Abused: No    Physically Abused: No    Sexually Abused: No    Family History: Family History  Problem Relation Age of Onset   Cirrhosis Mother        etoh  Colon cancer Neg Hx    Inflammatory bowel disease Neg Hx    Celiac disease Neg Hx    Esophageal cancer Neg Hx    Pancreatic cancer Neg Hx    Stomach cancer Neg Hx     Current Medications:  Current Outpatient Medications:    dicyclomine (BENTYL) 20 MG tablet, Take 1 tablet (20 mg total) by mouth in the morning, at noon, in the evening, and at bedtime., Disp: 60 tablet, Rfl: 2   omeprazole (PRILOSEC) 40 MG capsule, TAKE 1 CAPSULE BY MOUTH ONCE DAILY AS NEEDED, Disp: 30 capsule, Rfl: 0   sertraline (ZOLOFT) 25 MG tablet, Take 1 tablet by mouth once daily, Disp: 30 tablet, Rfl: 0   Allergies: No Known Allergies  REVIEW OF SYSTEMS:   Review of Systems  Constitutional:  Negative for chills, fatigue and fever.  HENT:   Negative for lump/mass, mouth sores, nosebleeds, sore throat and trouble swallowing.   Eyes:  Negative for eye problems.  Respiratory:  Positive for cough and shortness of breath.   Cardiovascular:  Negative for chest pain, leg swelling and palpitations.  Gastrointestinal:  Positive for abdominal pain, nausea and vomiting. Negative for constipation and diarrhea.  Genitourinary:  Negative for bladder incontinence, difficulty urinating, dysuria, frequency, hematuria and nocturia.   Musculoskeletal:  Negative for arthralgias, back pain, flank pain, myalgias and neck pain.  Skin:  Negative for itching and rash.  Neurological:  Negative for dizziness, headaches and numbness.  Hematological:  Does not bruise/bleed easily.  Psychiatric/Behavioral:  Positive for depression. Negative for sleep disturbance and  suicidal ideas. The patient is nervous/anxious.   All other systems reviewed and are negative.    VITALS:   Blood pressure 138/86, pulse 64, temperature 98.6 F (37 C), temperature source Oral, resp. rate 18, height '5\' 5"'$  (1.651 m), weight 156 lb 9.6 oz (71 kg), SpO2 98 %.  Wt Readings from Last 3 Encounters:  04/22/22 156 lb 9.6 oz (71 kg)  02/11/22 162 lb (73.5 kg)  02/07/22 160 lb 3.2 oz (72.7 kg)    Body mass index is 26.06 kg/m.   PHYSICAL EXAM:   Physical Exam Vitals reviewed.  Constitutional:      Appearance: Normal appearance.  Cardiovascular:     Rate and Rhythm: Normal rate and regular rhythm.     Pulses: Normal pulses.     Heart sounds: Normal heart sounds.  Pulmonary:     Effort: Pulmonary effort is normal.     Breath sounds: Normal breath sounds.  Abdominal:     Palpations: Abdomen is soft.     Comments: Tenderness in the left upper quadrant with no mass palpable.  Musculoskeletal:     Right lower leg: No edema.     Left lower leg: No edema.  Skin:    Comments: Mild erythematous scaling patch on the right lateral leg and medial leg.  Neurological:     Mental Status: He is alert and oriented to person, place, and time.  Psychiatric:        Mood and Affect: Mood normal.        Behavior: Behavior normal.     LABS:      Latest Ref Rng & Units 04/22/2022   12:23 PM 02/10/2022    2:21 PM 01/10/2022    6:25 PM  CBC  WBC 4.0 - 10.5 K/uL 12.6  14.2  16.5   Hemoglobin 13.0 - 17.0 g/dL 15.1  15.3  14.5   Hematocrit 39.0 - 52.0 %  43.1  43.4  40.8   Platelets 150 - 400 K/uL 241  277  226       Latest Ref Rng & Units 02/10/2022    2:21 PM 01/10/2022    6:25 PM 08/31/2021   10:16 AM  CMP  Glucose 70 - 99 mg/dL  105    BUN 6 - 20 mg/dL  17    Creatinine 0.61 - 1.24 mg/dL  0.90    Sodium 135 - 145 mmol/L  138    Potassium 3.5 - 5.1 mmol/L  3.8    Chloride 98 - 111 mmol/L  109    CO2 22 - 32 mmol/L  25    Calcium 8.9 - 10.3 mg/dL  9.0    Total Protein  6.0 - 8.5 g/dL 7.2  7.0  6.4   Total Bilirubin 0.0 - 1.2 mg/dL 0.4  1.8  0.6   Alkaline Phos 44 - 121 IU/L 65  41  48   AST 0 - 40 IU/L '15  27  21   '$ ALT 0 - 44 IU/L '15  22  17      '$ No results found for: "CEA1", "CEA" / No results found for: "CEA1", "CEA" No results found for: "PSA1" No results found for: "[id number]" No results found for: "CAN125"  No results found for: "TOTALPROTELP", "ALBUMINELP", "A1GS", "A2GS", "BETS", "BETA2SER", "GAMS", "MSPIKE", "SPEI" No results found for: "TIBC", "FERRITIN", "IRONPCTSAT" Lab Results  Component Value Date   LDH 135 04/22/2022     STUDIES:    Colonoscopy 04/14/2020 - Hemorrhoids found on perianal exam. - Diverticulosis in the sigmoid colon, in the descending colon, in the ascending colon and in the cecum.- Non-bleeding internal hemorrhoids. - Normal mucosa in the entire examined colon. - The examined portion of the ileum was normal. - No specimens collected.   No results found.

## 2022-04-27 LAB — BCR-ABL1 FISH
Cells Analyzed: 200
Cells Counted: 200

## 2022-04-28 LAB — SURGICAL PATHOLOGY

## 2022-04-29 ENCOUNTER — Other Ambulatory Visit: Payer: Self-pay | Admitting: Family Medicine

## 2022-04-29 DIAGNOSIS — F411 Generalized anxiety disorder: Secondary | ICD-10-CM

## 2022-04-29 DIAGNOSIS — K219 Gastro-esophageal reflux disease without esophagitis: Secondary | ICD-10-CM

## 2022-04-29 LAB — FLOW CYTOMETRY

## 2022-05-02 LAB — JAK2 V617F RFX CALR/MPL/E12-15

## 2022-05-02 LAB — CALR +MPL + E12-E15  (REFLEX)

## 2022-05-03 ENCOUNTER — Encounter: Payer: Self-pay | Admitting: Internal Medicine

## 2022-05-03 ENCOUNTER — Ambulatory Visit: Payer: Commercial Managed Care - HMO | Admitting: Internal Medicine

## 2022-05-03 VITALS — BP 115/74 | HR 72 | Temp 98.0°F | Ht 66.0 in | Wt 157.6 lb

## 2022-05-03 DIAGNOSIS — R112 Nausea with vomiting, unspecified: Secondary | ICD-10-CM | POA: Diagnosis not present

## 2022-05-03 DIAGNOSIS — R1012 Left upper quadrant pain: Secondary | ICD-10-CM | POA: Diagnosis not present

## 2022-05-03 MED ORDER — HYOSCYAMINE SULFATE 0.125 MG SL SUBL: 0.1250 mg | SUBLINGUAL_TABLET | Freq: Three times a day (TID) | SUBLINGUAL | 3 refills | Status: AC

## 2022-05-03 NOTE — Patient Instructions (Addendum)
It was nice to meet you today!  As discussed, you have had chronic abdominal pain for nearly a decade.  We might not find a cure for it.  I do recommend the following at this time:  Stop the Bentyl or dicyclomine  New prescription for Levsin sublingual 0.125 mg tablets dispense 40 with 3 refills take 1 under the tongue as needed before meals and at bedtime as needed (may take at other times but no more than 4 a day) for abdominal discomfort  It seems that you are somewhat constipated and may be not having very productive bowel movements  Trial of Linzess 72 1 gelcap daily facilitate bowel function.  Samples provided.  Take omeprazole 40 mg capsule every day 30 minutes before breakfast.  Take it daily whether you feel you need it or not.  Send a random urine sample to the lab for urine PBG keep urine sample away from the light is much as possible  Call us in 3 weeks and let us know how you are doing  Office visit with me in 6 weeks  As I have discussed with you today, marijuana is complicating the picture.  As previously recommended, I recommend you stop marijuana for at least 6 months so we can get a clear picture of your symptoms.  Likely, I will recommend you abstain from marijuana indefinitely.

## 2022-05-03 NOTE — Progress Notes (Signed)
Primary Care Physician:  Alvira Monday, [place] Primary Gastroenterologist:  Dr. Gala Romney  Pre-Procedure History & Physical: HPI:  Jacob Bradley is a 27 y.o. male here for follow-up chronic abdominal pain.  Patient reports abdominal pain for at least 10 years; he has been extensively evaluated here and elsewhere as chronicled in the medical record.  He tends to be constipated on a regular basis along with occasional paper hematochezia.  History of reflux esophagitis.  Known hemorrhoids on colonoscopy at [facility] 2 years ago.  He clearly told me that he has "lost 100 pounds in the past year"; Reviewing the record I discussed with him that I only see an approximate 5 to 6 pound weight loss over the past year.  Patient states he may have exaggerated.  He continues to smoke marijuana.  States he has anger and anxiety issues cites PTSD and bipolar attributing factors overdue to see his psychiatrist.  His abdominal pain is fleeting and nonspecific not associated with eating or having a bowel movement.  Can be worse at night.  There is no radiating component discomfort can be diffuse,  left-sided and localized to the right lower quadrant.  He continues to smoke marijuana.  Gets intermittently nauseated.  Does get relief with hot showers.  Reflux well-controlled with omeprazole but he only takes it intermittently.  He does note "the little blue pill" does help with his abdominal pain but oftentimes he cannot take it fast enough to get an effect.  Past Medical History:  Diagnosis Date   Bipolar disorder ([place])    PTSD (post-traumatic stress disorder)     Past Surgical History:  Procedure Laterality Date   COLONOSCOPY     none     UPPER GASTROINTESTINAL ENDOSCOPY      Prior to Admission medications   Medication Sig Start Date End Date Taking? Authorizing Provider  dicyclomine (BENTYL) 20 MG tablet Take 1 tablet (20 mg total) by mouth in the morning, at noon, in the evening, and at bedtime.  01/12/22  Yes Alvira Monday, FNP  omeprazole (PRILOSEC) 40 MG capsule TAKE 1 CAPSULE BY MOUTH ONCE DAILY AS NEEDED 04/29/22  Yes Alvira Monday, FNP  sertraline (ZOLOFT) 25 MG tablet Take 1 tablet by mouth once daily 04/29/22  Yes Alvira Monday, FNP    Allergies as of 05/03/2022   (No Known Allergies)    Family History  Problem Relation Age of Onset   Cirrhosis Mother        etoh   Colon cancer Neg Hx    Inflammatory bowel disease Neg Hx    Celiac disease Neg Hx    Esophageal cancer Neg Hx    Pancreatic cancer Neg Hx    Stomach cancer Neg Hx     Social History   Socioeconomic History   Marital status: Married    Spouse name: Not on file   Number of children: 1   Years of education: Not on file   Highest education level: Not on file  Occupational History   Not on file  Tobacco Use   Smoking status: Every Day    Packs/day: 1.00    Years: 8.00    Total pack years: 8.00    Types: Cigarettes   Smokeless tobacco: Never  Vaping Use   Vaping Use: Never used  Substance and Sexual Activity   Alcohol use: Not Currently   Drug use: Yes    Frequency: 3.0 times per week    Types: Marijuana    Comment:  occ   Sexual activity: Yes  Other Topics Concern   Not on file  Social History Narrative   Not on file   Social Determinants of Health   Financial Resource Strain: Not on file  Food Insecurity: Food Insecurity Present (04/22/2022)   Hunger Vital Sign    Worried About Running Out of Food in the Last Year: Never true    Ran Out of Food in the Last Year: Sometimes true  Transportation Needs: No Transportation Needs (04/22/2022)   PRAPARE - Hydrologist (Medical): No    Lack of Transportation (Non-Medical): No  Physical Activity: Not on file  Stress: Not on file  Social Connections: Not on file  Intimate Partner Violence: Not At Risk (04/22/2022)   Humiliation, Afraid, Rape, and Kick questionnaire    Fear of Current or Ex-Partner: No     Emotionally Abused: No    Physically Abused: No    Sexually Abused: No    Review of Systems: See HPI, otherwise negative ROS  Physical Exam: BP 115/74 (BP Location: Right Arm, Patient Position: Sitting, Cuff Size: Normal)   Pulse 72   Temp 98 F (36.7 C) (Oral)   Ht '5\' 6"'$  (1.676 m)   Wt 157 lb 9.6 oz (71.5 kg)   SpO2 96%   BMI 25.44 kg/m  General:   Alert,  pleasant and cooperative in NAD; companied by wife, [name]  Abdomen: Nondistended crescent moon shaped tattoo left upper quadrant.  Abdomen is minimally tender left upper quadrant right lower quadrant no appreciable mass hernia organomegaly   Impression/Plan: 27 year old with longstanding chronic abdominal pain.  Ongoing marijuana abuse.  Likely has element of hyperemesis cannabinoid syndrome.  But does have prominent abdominal complaints has been fairly extensively evaluated.  He is up-to-date on endoscopic evaluation.  Has had cross-sectional imaging.  He has not lost a significant amount of weight in contradistinction to what he told me.  He has ongoing psychiatric issues which have not been fully addressed. He needs a screen for AIP complete his evaluation for organic illness. As discussed, you have had chronic abdominal pain for nearly a decade.  We might not find a cure for it.   Recommendations:  I do recommend the following at this time:  Stop the Bentyl or dicyclomine  New prescription for Levsin sublingual 0.125 mg tablets dispense 40 with 3 refills take 1 under the tongue as needed before meals and at bedtime as needed (may take at other times but no more than 4 a day) for abdominal discomfort  Trial of Linzess 72 1 gelcap daily facilitate bowel function.  Samples provided.  Take omeprazole 40 mg capsule every day 30 minutes before breakfast.  Take it every day without fail.   We will send a random urine sample to the lab for urine PBG -  keep urine sample away from the light is much as possible  Call us in 3  weeks and let us know how you are doing  Office visit with me in 6 weeks  Marijuana is complicating the picture.  As previously recommended, I recommend popping marijuana for at least 6 months so we can get a clear picture of symptoms.  Ideally, complete abstinence from marijuana is recommended.     Notice: This dictation was prepared with Dragon dictation along with smaller phrase technology. Any transcriptional errors that result from this process are unintentional and may not be corrected upon review.

## 2022-05-04 ENCOUNTER — Other Ambulatory Visit (HOSPITAL_COMMUNITY): Payer: Self-pay

## 2022-05-13 ENCOUNTER — Ambulatory Visit: Payer: Medicaid Other | Admitting: Family Medicine

## 2022-05-13 VITALS — BP 127/82 | HR 85 | Ht 66.0 in | Wt 155.0 lb

## 2022-05-13 DIAGNOSIS — F411 Generalized anxiety disorder: Secondary | ICD-10-CM | POA: Diagnosis not present

## 2022-05-13 DIAGNOSIS — K219 Gastro-esophageal reflux disease without esophagitis: Secondary | ICD-10-CM

## 2022-05-13 DIAGNOSIS — K5909 Other constipation: Secondary | ICD-10-CM | POA: Diagnosis not present

## 2022-05-13 DIAGNOSIS — E559 Vitamin D deficiency, unspecified: Secondary | ICD-10-CM

## 2022-05-13 DIAGNOSIS — E0789 Other specified disorders of thyroid: Secondary | ICD-10-CM

## 2022-05-13 DIAGNOSIS — R1012 Left upper quadrant pain: Secondary | ICD-10-CM

## 2022-05-13 DIAGNOSIS — M25551 Pain in right hip: Secondary | ICD-10-CM | POA: Diagnosis not present

## 2022-05-13 DIAGNOSIS — R7301 Impaired fasting glucose: Secondary | ICD-10-CM

## 2022-05-13 DIAGNOSIS — E7849 Other hyperlipidemia: Secondary | ICD-10-CM

## 2022-05-13 MED ORDER — SERTRALINE HCL 25 MG PO TABS: 25.0000 mg | ORAL_TABLET | Freq: Every day | ORAL | 2 refills | Status: AC

## 2022-05-13 NOTE — Assessment & Plan Note (Signed)
Encouraged to continue taking omeprazole 40 mg daily as needed

## 2022-05-13 NOTE — Patient Instructions (Addendum)
I appreciate the opportunity to provide care to you today!    Follow up:  3 months  Labs: please stop by the lab during the week to get your blood drawn (CBC, CMP, TSH, Lipid profile, HgA1c, Vit D)   Please stop by [facility] hospital anytime to get an x-ray of your hips  Right hip pain I recommend taking Tylenol as needed for right hip pain Recommend heat application to the affected site   Please pick up your refill of Zoloft at the pharmacy   Please continue to a heart-healthy diet and increase your physical activities. Try to exercise for 28mins at least five days a week.      It was a pleasure to see you and I look forward to continuing to work together on your health and well-being. Please do not hesitate to call the office if you need care or have questions about your care.   Have a wonderful day and week. With Gratitude, Alvira Monday MSN, FNP-BC

## 2022-05-13 NOTE — Assessment & Plan Note (Signed)
Family history of arthritis Complains of stiffness and pain in the right hip Pain is pronounced at bedtime and with prolonged ambulation He complains of contralaterally hip pain when lying on the left side Pain is described as aching and stabbing Onset symptom for 2 months No recent injury or falls\ Sensation and neurovascularly intact No limitation in range of motion No deformity seen Normal gait noted in the clinic We will get an x-ray to rule out degenerative changes of the hip Encourage conservative management with over-the-counter Tylenol and heat applications to the affected site

## 2022-05-13 NOTE — Assessment & Plan Note (Signed)
He followed up with GI on 05/03/2022 Dicyclomine was discontinued, and the patient was started on levsin sublingual 0.125 mg tablets The patient reports not starting therapy yet He is scheduled to follow-up with GI in 6 weeks Encouraged to follow-up with GI as scheduled

## 2022-05-13 NOTE — Progress Notes (Signed)
Established Patient Office Visit  Subjective:  Patient ID: Jacob Bradley, male    DOB: January 14, 1996  Age: 27 y.o. MRN: [id number]  CC:  Chief Complaint  Patient presents with   Follow-up    3 month f/u. States GI doctor prescribed him new medications has not been able to pick this up. Was taken off of bentyl.     HPI Jacob Bradley is a 27 y.o. male with past medical history of IBS, GERD, anxiety presents for f/u of  chronic medical conditions. For the details of today's visit, please refer to the assessment and plan.  Past Medical History:  Diagnosis Date   Bipolar disorder ([place])    PTSD (post-traumatic stress disorder)     Past Surgical History:  Procedure Laterality Date   COLONOSCOPY     none     UPPER GASTROINTESTINAL ENDOSCOPY      Family History  Problem Relation Age of Onset   Cirrhosis Mother        etoh   Colon cancer Neg Hx    Inflammatory bowel disease Neg Hx    Celiac disease Neg Hx    Esophageal cancer Neg Hx    Pancreatic cancer Neg Hx    Stomach cancer Neg Hx     Social History   Socioeconomic History   Marital status: Married    Spouse name: Not on file   Number of children: 1   Years of education: Not on file   Highest education level: 9th grade  Occupational History   Not on file  Tobacco Use   Smoking status: Every Day    Packs/day: 1.00    Years: 8.00    Additional pack years: 0.00    Total pack years: 8.00    Types: Cigarettes   Smokeless tobacco: Never  Vaping Use   Vaping Use: Never used  Substance and Sexual Activity   Alcohol use: Not Currently   Drug use: Yes    Frequency: 3.0 times per week    Types: Marijuana    Comment: occ   Sexual activity: Yes  Other Topics Concern   Not on file  Social History Narrative   Not on file   Social Determinants of Health   Financial Resource Strain: Low Risk  (05/13/2022)   Overall Financial Resource Strain (CARDIA)    Difficulty of Paying Living Expenses: Not very hard  Food  Insecurity: No Food Insecurity (05/13/2022)   Hunger Vital Sign    Worried About Running Out of Food in the Last Year: Never true    Ran Out of Food in the Last Year: Never true  Recent Concern: Food Insecurity - Food Insecurity Present (04/22/2022)   Hunger Vital Sign    Worried About Running Out of Food in the Last Year: Never true    Ran Out of Food in the Last Year: Sometimes true  Transportation Needs: No Transportation Needs (05/13/2022)   PRAPARE - Hydrologist (Medical): No    Lack of Transportation (Non-Medical): No  Physical Activity: Unknown (05/13/2022)   Exercise Vital Sign    Days of Exercise per Week: 0 days    Minutes of Exercise per Session: Not on file  Stress: Stress Concern Present (05/13/2022)   [place]    Feeling of Stress : Rather much  Social Connections: Moderately Isolated (05/13/2022)   Social Connection and Isolation Panel [NHANES]    Frequency of Communication  with Friends and Family: More than three times a week    Frequency of Social Gatherings with Friends and Family: Once a week    Attends Religious Services: Never    Marine scientist or Organizations: No    Attends Music therapist: Not on file    Marital Status: Married  Human resources officer Violence: Not At Risk (04/22/2022)   Humiliation, Afraid, Rape, and Kick questionnaire    Fear of Current or Ex-Partner: No    Emotionally Abused: No    Physically Abused: No    Sexually Abused: No    Outpatient Medications Prior to Visit  Medication Sig Dispense Refill   linaCLOtide (LINZESS PO) Take 72 mg by mouth. Samples were given from GI     hyoscyamine (LEVSIN SL) 0.125 MG SL tablet Place 1 tablet (0.125 mg total) under the tongue 4 (four) times daily -  before meals and at bedtime. (Patient not taking: Reported on 05/13/2022) 40 tablet 3   omeprazole (PRILOSEC) 40 MG capsule TAKE 1 CAPSULE BY MOUTH  ONCE DAILY AS NEEDED (Patient not taking: Reported on 05/13/2022) 30 capsule 0   sertraline (ZOLOFT) 25 MG tablet Take 1 tablet by mouth once daily (Patient not taking: Reported on 05/13/2022) 30 tablet 0   No facility-administered medications prior to visit.    No Known Allergies  ROS Review of Systems  Constitutional:  Negative for fatigue and fever.  Eyes:  Negative for visual disturbance.  Respiratory:  Negative for chest tightness and shortness of breath.   Cardiovascular:  Negative for chest pain and palpitations.  Neurological:  Negative for dizziness and headaches.  Psychiatric/Behavioral:  Negative for suicidal ideas.       Objective:    Physical Exam HENT:     Head: Normocephalic.     Right Ear: External ear normal.     Left Ear: External ear normal.     Nose: No congestion or rhinorrhea.     Mouth/Throat:     Mouth: Mucous membranes are moist.  Cardiovascular:     Rate and Rhythm: Regular rhythm.     Heart sounds: No murmur heard. Pulmonary:     Effort: No respiratory distress.     Breath sounds: Normal breath sounds.  Musculoskeletal:     Right hip: Tenderness present. No deformity or crepitus. Normal range of motion. Normal strength.  Neurological:     Mental Status: He is alert.     BP 127/82   Pulse 85   Ht 5\' 6"  (1.676 m)   Wt 155 lb 0.6 oz (70.3 kg)   SpO2 95%   BMI 25.02 kg/m  Wt Readings from Last 3 Encounters:  05/13/22 155 lb 0.6 oz (70.3 kg)  05/03/22 157 lb 9.6 oz (71.5 kg)  04/22/22 156 lb 9.6 oz (71 kg)    Lab Results  Component Value Date   TSH 1.150 07/29/2021   Lab Results  Component Value Date   WBC 12.6 (H) 04/22/2022   HGB 15.1 04/22/2022   HCT 43.1 04/22/2022   MCV 91.5 04/22/2022   PLT 241 04/22/2022   Lab Results  Component Value Date   NA 138 01/10/2022   K 3.8 01/10/2022   CO2 25 01/10/2022   GLUCOSE 105 (H) 01/10/2022   BUN 17 01/10/2022   CREATININE 0.90 01/10/2022   BILITOT 0.4 02/10/2022   ALKPHOS 65  02/10/2022   AST 15 02/10/2022   ALT 15 02/10/2022   PROT 7.2 02/10/2022   ALBUMIN 4.8  02/10/2022   CALCIUM 9.0 01/10/2022   ANIONGAP 4 (L) 01/10/2022   EGFR 96 07/29/2021   Lab Results  Component Value Date   CHOL 187 07/29/2021   Lab Results  Component Value Date   HDL 42 07/29/2021   Lab Results  Component Value Date   LDLCALC 119 (H) 07/29/2021   Lab Results  Component Value Date   TRIG 143 07/29/2021   Lab Results  Component Value Date   CHOLHDL 4.5 07/29/2021   Lab Results  Component Value Date   HGBA1C 5.5 07/29/2021      Assessment & Plan:  Right hip pain Assessment & Plan: Family history of arthritis Complains of stiffness and pain in the right hip Pain is pronounced at bedtime and with prolonged ambulation He complains of contralaterally hip pain when lying on the left side Pain is described as aching and stabbing Onset symptom for 2 months No recent injury or falls\ Sensation and neurovascularly intact No limitation in range of motion No deformity seen Normal gait noted in the clinic We will get an x-ray to rule out degenerative changes of the hip Encourage conservative management with over-the-counter Tylenol and heat applications to the affected site  Orders: -     DG Pelvis 1-2 Views  LUQ pain Assessment & Plan: He followed up with GI on 05/03/2022 Dicyclomine was discontinued, and the patient was started on levsin sublingual 0.125 mg tablets The patient reports not starting therapy yet He is scheduled to follow-up with GI in 6 weeks Encouraged to follow-up with GI as scheduled   GAD (generalized anxiety disorder) Assessment & Plan: Reports occasionally having thoughts of self-harm but no written plan to kill himself Encouraged the patient to report to the psychiatric ED when having thoughts of self-harm He takes Zoloft 25 mg daily and is requesting a refill today  Refill sent to the pharmacy  Orders: -     Sertraline HCl; Take 1  tablet (25 mg total) by mouth daily.  Dispense: 30 tablet; Refill: 2  Other constipation Assessment & Plan: He takes Linzess 72 mg daily as needed for constipation   Gastroesophageal reflux disease without esophagitis Assessment & Plan: Encouraged to continue taking omeprazole 40 mg daily as needed   Other specified disorders of thyroid -     TSH  Other hyperlipidemia -     CBC with Differential/Platelet -     CMP14+EGFR -     Lipid panel  Impaired fasting blood sugar -     Hemoglobin A1c  Vitamin D deficiency -     VITAMIN D 25 Hydroxy (Vit-D Deficiency, Fractures)    Follow-up: Return in about 3 months (around 08/13/2022).   Alvira Monday, FNP

## 2022-05-13 NOTE — Assessment & Plan Note (Signed)
He takes Linzess 72 mg daily as needed for constipation

## 2022-05-13 NOTE — Assessment & Plan Note (Signed)
Reports occasionally having thoughts of self-harm but no written plan to kill himself Encouraged the patient to report to the psychiatric ED when having thoughts of self-harm He takes Zoloft 25 mg daily and is requesting a refill today  Refill sent to the pharmacy

## 2022-05-19 ENCOUNTER — Inpatient Hospital Stay: Payer: Medicaid Other | Admitting: Hematology

## 2022-05-19 DIAGNOSIS — D72829 Elevated white blood cell count, unspecified: Secondary | ICD-10-CM | POA: Diagnosis not present

## 2022-05-19 NOTE — Progress Notes (Signed)
[facility] [address], [place] [address]   Clinic Day:  05/20/2022  Referring physician: Alvira Monday, [place]  Patient Care Team: Alvira Monday, [facility] as PCP - General (Family Medicine) Derek Jack, MD as Medical Oncologist (Hematology)   ASSESSMENT & PLAN:   Assessment:  1.  Leukocytosis: - Patient seen at the request of Neil Crouch, PA-C. - CBC (02/10/2022): WBC 14.2, ANC 9K, ALC 4.2K - Mildly elevated white count since February 2018. - CTAP (03/24/2022): Normal spleen.  No acute findings. - Weight loss 94 pounds in the last 2 years.  Decreased appetite and feels up easily.  Reports left upper quadrant pain for the last 2 to 3 years, worsening lately.  Pain starts when he has a bowel movement.  It gets better when he takes a hot shower. - Also reports drenching night sweats, every night for more than a year.  No evening fevers.  No infections.  No oral steroids. - He uses cortisone cream approximately once per week on the right leg for rash and itching.  2.  Social/family history: - He lives at home with his wife.  He has not been able to work for the last 2 to 3 years due to abdominal pain.  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 - He is current active smoker, smokes 1 pack/day for the last 1 year.  Prior to that he smoked 2 packs/day for 14 years. - Family history significant for Crohn's disease and multiple members.  No history of malignancy.  Plan:  1.  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  ALC 4.8.  ANC normal.  Differential grossly normal. - Flow cytometry: No immunophenotypic evidence of lymphoproliferative disorder. - BCR/ABL by FISH and JAK2 V617F mutation was negative. - CTAP (03/24/2022): Spleen within normal limits.  No adenopathy or other abnormal findings. - Leukocytosis most likely reactive from smoking.  He smokes 1 pack/day. - If there is  any significant changes in the white count, will consider bone marrow aspiration and biopsy.  Otherwise monitor with follow-up in 6 months with labs.     Orders Placed This Encounter  Procedures   CBC with Differential/Platelet    Standing Status:   Future    Standing Expiration Date:   05/19/2023    Order Specific Question:   Release to patient    Answer:   Immediate   Lactate dehydrogenase    Standing Status:   Future    Standing Expiration Date:   05/19/2023    Order Specific Question:   Release to patient    Answer:   Immediate     I,Alexis Herring,acting as a scribe for Derek Jack, MD.,have documented all relevant documentation on the behalf of Derek Jack, MD,as directed by  Derek Jack, MD while in the presence of Derek Jack, MD.  I, Derek Jack MD, have reviewed the above documentation for accuracy and completeness, and I agree with the above.    Derek Jack, MD   3/29/20242:39 PM  CHIEF COMPLAINT/PURPOSE OF CONSULT:   Diagnosis: Leukocytosis  Current Therapy: Under workup  HISTORY OF PRESENT ILLNESS:   Jacob Bradley is a 27 y.o. male presenting to clinic today for follow up of leukocytosis. He was last seen by me on 04/22/22 for consult.  Today, he states that he is doing okay overall. His appetite level is at 60%. His energy level is at 40%. He  states that he saw his GI provider who suspects that he has IBS with constipation. He has been trying to monitor his diet to relieve his constipation. He is a smoker- he has cut back from 2ppd to 1 pack every 1-2 days.  PAST MEDICAL HISTORY:   Past Medical History: Past Medical History:  Diagnosis Date   Bipolar disorder ([place])    PTSD (post-traumatic stress disorder)     Surgical History: Past Surgical History:  Procedure Laterality Date   COLONOSCOPY     none     UPPER GASTROINTESTINAL ENDOSCOPY      Social History: Social History   Socioeconomic History   Marital  status: Married    Spouse name: Not on file   Number of children: 1   Years of education: Not on file   Highest education level: 9th grade  Occupational History   Not on file  Tobacco Use   Smoking status: Every Day    Packs/day: 1.00    Years: 8.00    Additional pack years: 0.00    Total pack years: 8.00    Types: Cigarettes   Smokeless tobacco: Never  Vaping Use   Vaping Use: Never used  Substance and Sexual Activity   Alcohol use: Not Currently   Drug use: Yes    Frequency: 3.0 times per week    Types: Marijuana    Comment: occ   Sexual activity: Yes  Other Topics Concern   Not on file  Social History Narrative   Not on file   Social Determinants of Health   Financial Resource Strain: Low Risk  (05/13/2022)   Overall Financial Resource Strain (CARDIA)    Difficulty of Paying Living Expenses: Not very hard  Food Insecurity: No Food Insecurity (05/13/2022)   Hunger Vital Sign    Worried About Running Out of Food in the Last Year: Never true    Ran Out of Food in the Last Year: Never true  Recent Concern: Food Insecurity - Food Insecurity Present (04/22/2022)   Hunger Vital Sign    Worried About Running Out of Food in the Last Year: Never true    Ran Out of Food in the Last Year: Sometimes true  Transportation Needs: No Transportation Needs (05/13/2022)   PRAPARE - Hydrologist (Medical): No    Lack of Transportation (Non-Medical): No  Physical Activity: Unknown (05/13/2022)   Exercise Vital Sign    Days of Exercise per Week: 0 days    Minutes of Exercise per Session: Not on file  Stress: Stress Concern Present (05/13/2022)   [place]    Feeling of Stress : Rather much  Social Connections: Moderately Isolated (05/13/2022)   Social Connection and Isolation Panel [NHANES]    Frequency of Communication with Friends and Family: More than three times a week    Frequency of Social  Gatherings with Friends and Family: Once a week    Attends Religious Services: Never    Marine scientist or Organizations: No    Attends Music therapist: Not on file    Marital Status: Married  Human resources officer Violence: Not At Risk (04/22/2022)   Humiliation, Afraid, Rape, and Kick questionnaire    Fear of Current or Ex-Partner: No    Emotionally Abused: No    Physically Abused: No    Sexually Abused: No    Family History: Family History  Problem Relation Age of Onset  Cirrhosis Mother        etoh   Colon cancer Neg Hx    Inflammatory bowel disease Neg Hx    Celiac disease Neg Hx    Esophageal cancer Neg Hx    Pancreatic cancer Neg Hx    Stomach cancer Neg Hx     Current Medications:  Current Outpatient Medications:    hyoscyamine (LEVSIN SL) 0.125 MG SL tablet, Place 1 tablet (0.125 mg total) under the tongue 4 (four) times daily -  before meals and at bedtime., Disp: 40 tablet, Rfl: 3   linaCLOtide (LINZESS PO), Take 72 mg by mouth. Samples were given from GI, Disp: , Rfl:    omeprazole (PRILOSEC) 40 MG capsule, TAKE 1 CAPSULE BY MOUTH ONCE DAILY AS NEEDED, Disp: 30 capsule, Rfl: 0   sertraline (ZOLOFT) 25 MG tablet, Take 1 tablet (25 mg total) by mouth daily., Disp: 30 tablet, Rfl: 2   Allergies: No Known Allergies  REVIEW OF SYSTEMS:   Review of Systems  Constitutional:  Negative for chills, fatigue and fever.  HENT:   Negative for lump/mass, mouth sores, nosebleeds, sore throat and trouble swallowing.   Eyes:  Negative for eye problems.  Respiratory:  Positive for cough and shortness of breath.   Cardiovascular:  Positive for chest pain and palpitations. Negative for leg swelling.  Gastrointestinal:  Positive for abdominal pain (left sided abdominal pain of 7/10 in severity), constipation, nausea and vomiting. Negative for diarrhea.  Genitourinary:  Negative for bladder incontinence, difficulty urinating, dysuria, frequency, hematuria and  nocturia.   Musculoskeletal:  Negative for arthralgias, back pain, flank pain, myalgias and neck pain.  Skin:  Negative for itching and rash.  Neurological:  Positive for dizziness. Negative for headaches and numbness.       + tingling in fingertips  Hematological:  Does not bruise/bleed easily.  Psychiatric/Behavioral:  Positive for sleep disturbance. Negative for depression and suicidal ideas. The patient is nervous/anxious.   All other systems reviewed and are negative.    VITALS:   There were no vitals taken for this visit.  Wt Readings from Last 3 Encounters:  05/13/22 155 lb 0.6 oz (70.3 kg)  05/03/22 157 lb 9.6 oz (71.5 kg)  04/22/22 156 lb 9.6 oz (71 kg)    There is no height or weight on file to calculate BMI.   PHYSICAL EXAM:   Physical Exam Vitals and nursing note reviewed. Exam conducted with a chaperone present.  Constitutional:      Appearance: Normal appearance.  Cardiovascular:     Rate and Rhythm: Normal rate and regular rhythm.     Pulses: Normal pulses.     Heart sounds: Normal heart sounds.  Pulmonary:     Effort: Pulmonary effort is normal.     Breath sounds: Normal breath sounds.  Abdominal:     Palpations: Abdomen is soft. There is no hepatomegaly, splenomegaly or mass.     Tenderness: There is no abdominal tenderness.  Musculoskeletal:     Right lower leg: No edema.     Left lower leg: No edema.  Lymphadenopathy:     Cervical: No cervical adenopathy.     Right cervical: No superficial, deep or posterior cervical adenopathy.    Left cervical: No superficial, deep or posterior cervical adenopathy.     Upper Body:     Right upper body: No supraclavicular or axillary adenopathy.     Left upper body: No supraclavicular or axillary adenopathy.  Neurological:     General:  No focal deficit present.     Mental Status: He is alert and oriented to person, place, and time.  Psychiatric:        Mood and Affect: Mood normal.        Behavior: Behavior  normal.     LABS:      Latest Ref Rng & Units 04/22/2022   12:23 PM 02/10/2022    2:21 PM 01/10/2022    6:25 PM  CBC  WBC 4.0 - 10.5 K/uL 12.6  14.2  16.5   Hemoglobin 13.0 - 17.0 g/dL 15.1  15.3  14.5   Hematocrit 39.0 - 52.0 % 43.1  43.4  40.8   Platelets 150 - 400 K/uL 241  277  226       Latest Ref Rng & Units 02/10/2022    2:21 PM 01/10/2022    6:25 PM 08/31/2021   10:16 AM  CMP  Glucose 70 - 99 mg/dL  105    BUN 6 - 20 mg/dL  17    Creatinine 0.61 - 1.24 mg/dL  0.90    Sodium 135 - 145 mmol/L  138    Potassium 3.5 - 5.1 mmol/L  3.8    Chloride 98 - 111 mmol/L  109    CO2 22 - 32 mmol/L  25    Calcium 8.9 - 10.3 mg/dL  9.0    Total Protein 6.0 - 8.5 g/dL 7.2  7.0  6.4   Total Bilirubin 0.0 - 1.2 mg/dL 0.4  1.8  0.6   Alkaline Phos 44 - 121 IU/L 65  41  48   AST 0 - 40 IU/L 15  27  21    ALT 0 - 44 IU/L 15  22  17     No results found for: "CEA1", "CEA" / No results found for: "CEA1", "CEA" No results found for: "PSA1" No results found for: "[id number]" No results found for: "CAN125"  No results found for: "TOTALPROTELP", "ALBUMINELP", "A1GS", "A2GS", "BETS", "BETA2SER", "GAMS", "MSPIKE", "SPEI" No results found for: "TIBC", "FERRITIN", "IRONPCTSAT" Lab Results  Component Value Date   LDH 135 04/22/2022   STUDIES:    No results found.

## 2022-05-19 NOTE — Patient Instructions (Signed)
[facility]  Discharge Instructions  You were seen and examined today by Dr. Katragadda.  Dr. Katragadda discussed your most recent lab work which revealed that everything looks good and stable.  Follow-up as scheduled in 6 months with labs.    Thank you for choosing [facility] to provide your oncology and hematology care.   To afford each patient quality time with our provider, please arrive at least 15 minutes before your scheduled appointment time. You may need to reschedule your appointment if you arrive late (10 or more minutes). Arriving late affects you and other patients whose appointments are after yours.  Also, if you miss three or more appointments without notifying the office, you may be dismissed from the clinic at the provider's discretion.    Again, thank you for choosing [facility].  Our hope is that these requests will decrease the amount of time that you wait before being seen by our physicians.   If you have a lab appointment with the Cancer Center please come in thru the Main Entrance and check in at the main information desk.           _____________________________________________________________  Should you have questions after your visit to  Cancer Center, please contact our office at [phone] and follow the prompts.  Our office hours are 8:00 a.m. to 4:30 p.m. Monday - Thursday and 8:00 a.m. to 2:30 p.m. Friday.  Please note that voicemails left after 4:00 p.m. may not be returned until the following business day.  We are closed weekends and all major holidays.  You do have access to a nurse 24-7, just call the main number to the clinic [phone] and do not press any options, hold on the line and a nurse will answer the phone.    For prescription refill requests, have your pharmacy contact our office and allow 72 hours.    Masks are optional in the cancer centers. If you would  like for your care team to wear a mask while they are taking care of you, please let them know. You may have one support person who is at least 27 years old accompany you for your appointments.  

## 2022-06-21 ENCOUNTER — Ambulatory Visit: Payer: Medicaid Other | Admitting: Internal Medicine

## 2022-06-21 ENCOUNTER — Encounter: Payer: Self-pay | Admitting: Internal Medicine

## 2022-07-01 ENCOUNTER — Other Ambulatory Visit: Payer: Self-pay | Admitting: Family Medicine

## 2022-07-01 DIAGNOSIS — K219 Gastro-esophageal reflux disease without esophagitis: Secondary | ICD-10-CM

## 2022-07-10 ENCOUNTER — Other Ambulatory Visit: Payer: Self-pay

## 2022-07-10 ENCOUNTER — Encounter (HOSPITAL_BASED_OUTPATIENT_CLINIC_OR_DEPARTMENT_OTHER): Payer: Self-pay

## 2022-07-10 ENCOUNTER — Emergency Department
Admission: EM | Admit: 2022-07-10 | Discharge: 2022-07-10 | Disposition: A | Discharge status: Home / Self Care | Payer: BLUE CROSS/BLUE SHIELD | Attending: Emergency Medicine | Admitting: Emergency Medicine

## 2022-07-10 ENCOUNTER — Emergency Department (HOSPITAL_BASED_OUTPATIENT_CLINIC_OR_DEPARTMENT_OTHER): Payer: BLUE CROSS/BLUE SHIELD

## 2022-07-10 DIAGNOSIS — R1084 Generalized abdominal pain: Secondary | ICD-10-CM

## 2022-07-10 DIAGNOSIS — G8929 Other chronic pain: Secondary | ICD-10-CM

## 2022-07-10 DIAGNOSIS — K589 Irritable bowel syndrome without diarrhea: Secondary | ICD-10-CM

## 2022-07-10 LAB — URINALYSIS, MACRO/MICRO
BILIRUBIN: NEGATIVE mg/dL
BLOOD: NEGATIVE mg/dL
GLUCOSE: NEGATIVE mg/dL
KETONES: NEGATIVE mg/dL
LEUKOCYTES: NEGATIVE WBCs/uL
NITRITE: NEGATIVE
PH: 7 (ref 4.6–8.0)
PROTEIN: NEGATIVE mg/dL
SPECIFIC GRAVITY: 1.01 (ref 1.003–1.035)
UROBILINOGEN: 0.2 mg/dL (ref 0.2–1.0)

## 2022-07-10 LAB — BLUE TOP TUBE

## 2022-07-10 LAB — COMPREHENSIVE METABOLIC PANEL, NON-FASTING
ALBUMIN/GLOBULIN RATIO: 1.2 (ref 0.8–1.4)
ALBUMIN: 3.9 g/dL (ref 3.4–5.0)
ALKALINE PHOSPHATASE: 60 U/L (ref 46–116)
ALT (SGPT): 26 U/L (ref <=78)
ANION GAP: 10 mmol/L (ref 4–13)
AST (SGOT): 17 U/L (ref 15–37)
BILIRUBIN TOTAL: 1 mg/dL (ref 0.2–1.0)
BUN/CREA RATIO: 10
BUN: 13 mg/dL (ref 7–18)
CALCIUM, CORRECTED: 9.3 mg/dL
CALCIUM: 9.2 mg/dL (ref 8.5–10.1)
CHLORIDE: 100 mmol/L (ref 98–107)
CO2 TOTAL: 28 mmol/L (ref 21–32)
CREATININE: 1.24 mg/dL (ref 0.70–1.30)
ESTIMATED GFR: 82 mL/min/{1.73_m2} (ref >59)
GLOBULIN: 3.2
GLUCOSE: 121 mg/dL — ABNORMAL HIGH (ref 74–106)
OSMOLALITY, CALCULATED: 277 mOsm/kg (ref 270–290)
POTASSIUM: 3.4 mmol/L — ABNORMAL LOW (ref 3.5–5.1)
PROTEIN TOTAL: 7.1 g/dL (ref 6.4–8.2)
SODIUM: 138 mmol/L (ref 136–145)

## 2022-07-10 LAB — CBC WITH DIFF
BASOPHIL #: 0.04 10*3/uL (ref 0.00–0.30)
BASOPHIL %: 0 % (ref 0–3)
EOSINOPHIL #: 0.17 10*3/uL (ref 0.00–0.80)
EOSINOPHIL %: 2 % (ref 0–7)
HCT: 42.3 % (ref 42.0–51.0)
HGB: 14.3 g/dL (ref 13.5–18.0)
LYMPHOCYTE #: 2.6 10*3/uL (ref 1.10–5.00)
LYMPHOCYTE %: 25 % (ref 25–45)
MCH: 32.3 pg — ABNORMAL HIGH (ref 27.0–32.0)
MCHC: 33.7 g/dL (ref 32.0–36.0)
MCV: 95.7 fL (ref 78.0–99.0)
MONOCYTE #: 0.8 10*3/uL (ref 0.00–1.30)
MONOCYTE %: 8 % (ref 0–12)
MPV: 7.8 fL (ref 7.4–10.4)
NEUTROPHIL #: 6.79 10*3/uL (ref 1.80–8.40)
NEUTROPHIL %: 65 % (ref 40–76)
PLATELETS: 266 10*3/uL (ref 140–440)
RBC: 4.42 10*6/uL (ref 4.20–6.00)
RDW: 15 % — ABNORMAL HIGH (ref 11.6–14.8)
WBC: 10.4 10*3/uL (ref 4.0–10.5)

## 2022-07-10 LAB — LIPASE: LIPASE: 40 U/L (ref 16–77)

## 2022-07-10 LAB — GRAY TOP TUBE

## 2022-07-10 MED ORDER — OMEPRAZOLE 20 MG CAPSULE,DELAYED RELEASE
40.0000 mg | DELAYED_RELEASE_CAPSULE | Freq: Every day | ORAL | 0 refills | Status: AC
Start: 2022-07-10 — End: 2022-08-09

## 2022-07-10 MED ORDER — KETOROLAC 30 MG/ML (1 ML) INJECTION SOLUTION
INTRAMUSCULAR | Status: AC
Start: 2022-07-10 — End: 2022-07-10
  Filled 2022-07-10: qty 1

## 2022-07-10 MED ORDER — FAMOTIDINE 20 MG TABLET
ORAL_TABLET | ORAL | Status: AC
Start: 2022-07-10 — End: 2022-07-10
  Filled 2022-07-10: qty 1

## 2022-07-10 MED ORDER — DICYCLOMINE 10 MG CAPSULE
20.0000 mg | ORAL_CAPSULE | ORAL | Status: AC
Start: 2022-07-10 — End: 2022-07-10
  Administered 2022-07-10: 20 mg via ORAL

## 2022-07-10 MED ORDER — DICYCLOMINE 20 MG TABLET
ORAL_TABLET | ORAL | Status: AC
Start: 2022-07-10 — End: 2022-07-10
  Filled 2022-07-10: qty 1

## 2022-07-10 MED ORDER — FAMOTIDINE 20 MG TABLET
40.0000 mg | ORAL_TABLET | ORAL | Status: AC
Start: 2022-07-10 — End: 2022-07-10
  Administered 2022-07-10: 40 mg via ORAL

## 2022-07-10 MED ORDER — KETOROLAC 30 MG/ML (1 ML) INJECTION SOLUTION
30.0000 mg | Freq: Once | INTRAMUSCULAR | Status: AC
Start: 2022-07-10 — End: 2022-07-10
  Administered 2022-07-10: 30 mg via INTRAMUSCULAR

## 2022-07-10 MED ORDER — DICYCLOMINE 20 MG TABLET
20.0000 mg | ORAL_TABLET | Freq: Four times a day (QID) | ORAL | 0 refills | Status: AC
Start: 2022-07-10 — End: 2022-07-24

## 2022-07-10 NOTE — ED Triage Notes (Signed)
Patient states that he has LUQ abdominal pain that he describes as squeezing for "two years". States that the pain is worse with eating. States that he has had EGD's that show "internal hemorrhoids". States that he takes medication for this, but is unsure of what it is. He has not taken it for 4 days.

## 2022-07-10 NOTE — ED Nurses Note (Signed)
Patient states he has had left sided abdominal pain x2 years. Patient states starting four days ago he has not had medication for his pain. Patient reports not being able to eat due to pain and nausea. Patient reports no bowel movement for four days. Patient states pain radiates up and down the left side.

## 2022-07-10 NOTE — ED Nurses Note (Signed)
Patient up for discharge. Discharge instructions given to this nurse. Upon entering patients room to pass on discharge instructions patient not found in room.

## 2022-07-10 NOTE — Discharge Instructions (Signed)
Please follow-up with gastroenterology as recommended

## 2022-07-10 NOTE — ED Attending Note (Addendum)
[facility] [place] emergency department         HISTORY OF PRESENT ILLNESS     Date:  07/10/2022  Patient's Name:  Francisco Carlson  Date of Birth:  [birth year]    Patient is a 27 year old presenting with left upper quadrant abdominal pain.  Patient states he has had this pain for 2 years.  He has been seen by multiple physicians ER gastroenterologist for the same pain.  Patient states cramping pain is worse prompting him to come to the ER.  A review of the patient's records showed that the patient has had multiple ER visits related to the same.        Review of Systems     Review of Systems   Constitutional: Negative.    HENT: Negative.     Respiratory: Negative.     Gastrointestinal:  Positive for abdominal pain and nausea.   All other systems reviewed and are negative.      Previous History     Past Medical History:  No past medical history on file.    Past Surgical History:  Past Surgical History:   Procedure Laterality Date    No past surgeries         Social History:     Social History     Substance and Sexual Activity   Drug Use Not on file       Family History:  No family history on file.    Medication History:  Current Outpatient Medications   Medication Sig    dicyclomine (BENTYL) 20 mg Oral Tablet Take 1 Tablet (20 mg total) by mouth Four times a day for 14 days    hyoscyamine sulfate (LEVSIN/SL) 0.125 mg Sublingual Tablet, Sublingual Place 1 Tablet (0.125 mg total) under the tongue Four times a day    linaCLOtide (LINZESS) 72 mcg Oral Capsule Take 1 Capsule (72 mcg total) by mouth Every morning    omeprazole (PRILOSEC) 20 mg Oral Capsule, Delayed Release(E.C.) Take 2 Capsules (40 mg total) by mouth Once a day for 30 days    sertraline (ZOLOFT) 25 mg Oral Tablet Take 1 Tablet (25 mg total) by mouth Once a day       Allergies:  No Known Allergies    Physical Exam     Vitals:    BP (!) 150/91   Pulse 95   Temp 37.8 C (100 F)   Resp 18   Ht 1.702 m (5\' 7" )   Wt 56.7 kg (125  lb)   SpO2 94%   BMI 19.58 kg/m           Physical Exam  Vitals and nursing note reviewed.   Constitutional:       General: He is not in acute distress.     Appearance: He is well-developed.   HENT:      Head: Normocephalic and atraumatic.      Right Ear: Tympanic membrane normal.      Left Ear: Tympanic membrane normal.      Mouth/Throat:      Mouth: Mucous membranes are dry.   Eyes:      Conjunctiva/sclera: Conjunctivae normal.   Cardiovascular:      Rate and Rhythm: Normal rate and regular rhythm.      Heart sounds: No murmur heard.  Pulmonary:      Effort: Pulmonary effort is normal. No respiratory distress.      Breath sounds: Normal breath sounds.   Abdominal:  General: Bowel sounds are normal.      Palpations: Abdomen is soft.      Tenderness: There is no abdominal tenderness.   Musculoskeletal:         General: No swelling.      Cervical back: Normal range of motion and neck supple.   Skin:     General: Skin is warm and dry.      Capillary Refill: Capillary refill takes less than 2 seconds.   Neurological:      General: No focal deficit present.      Mental Status: He is alert and oriented to person, place, and time.   Psychiatric:         Mood and Affect: Mood normal.         Diagnostic Studies/Treatment     Medications:  Medications Administered in the ED   famotidine (PEPCID) tablet (has no administration in time range)   ketorolac (TORADOL) 30 mg/mL injection (has no administration in time range)   dicyclomine (BENTYL) capsule (has no administration in time range)       New Prescriptions    DICYCLOMINE (BENTYL) 20 MG ORAL TABLET    Take 1 Tablet (20 mg total) by mouth Four times a day for 14 days    OMEPRAZOLE (PRILOSEC) 20 MG ORAL CAPSULE, DELAYED RELEASE(E.C.)    Take 2 Capsules (40 mg total) by mouth Once a day for 30 days       Labs:    Results for orders placed or performed during the hospital encounter of 07/10/22 (from the past 12 hour(s))   COMPREHENSIVE METABOLIC PANEL, NON-FASTING    Result Value Ref Range    SODIUM 138 136 - 145 mmol/L    POTASSIUM 3.4 (L) 3.5 - 5.1 mmol/L    CHLORIDE 100 98 - 107 mmol/L    CO2 TOTAL 28 21 - 32 mmol/L    ANION GAP 10 4 - 13 mmol/L    BUN 13 7 - 18 mg/dL    CREATININE 1.61 0.96 - 1.30 mg/dL    BUN/CREA RATIO 10     ESTIMATED GFR 82 >59 mL/min/1.57m^2    ALBUMIN 3.9 3.4 - 5.0 g/dL    CALCIUM 9.2 8.5 - 04.5 mg/dL    GLUCOSE 409 (H) 74 - 106 mg/dL    ALKALINE PHOSPHATASE 60 46 - 116 U/L    ALT (SGPT) 26 <=78 U/L    AST (SGOT) 17 15 - 37 U/L    BILIRUBIN TOTAL 1.0 0.2 - 1.0 mg/dL    PROTEIN TOTAL 7.1 6.4 - 8.2 g/dL    ALBUMIN/GLOBULIN RATIO 1.2 0.8 - 1.4    OSMOLALITY, CALCULATED 277 270 - 290 mOsm/kg    CALCIUM, CORRECTED 9.3 mg/dL    GLOBULIN 3.2    LIPASE   Result Value Ref Range    LIPASE 40 16 - 77 U/L   CBC WITH DIFF   Result Value Ref Range    WBC 10.4 4.0 - 10.5 x10^3/uL    RBC 4.42 4.20 - 6.00 x10^6/uL    HGB 14.3 13.5 - 18.0 g/dL    HCT 81.1 91.4 - 78.2 %    MCV 95.7 78.0 - 99.0 fL    MCH 32.3 (H) 27.0 - 32.0 pg    MCHC 33.7 32.0 - 36.0 g/dL    RDW 95.6 (H) 21.3 - 14.8 %    PLATELETS 266 140 - 440 x10^3/uL    MPV 7.8 7.4 - 10.4 fL    NEUTROPHIL %  65 40 - 76 %    LYMPHOCYTE % 25 25 - 45 %    MONOCYTE % 8 0 - 12 %    EOSINOPHIL % 2 0 - 7 %    BASOPHIL % 0 0 - 3 %    NEUTROPHIL # 6.79 1.80 - 8.40 x10^3/uL    LYMPHOCYTE # 2.60 1.10 - 5.00 x10^3/uL    MONOCYTE # 0.80 0.00 - 1.30 x10^3/uL    EOSINOPHIL # 0.17 0.00 - 0.80 x10^3/uL    BASOPHIL # 0.04 0.00 - 0.30 x10^3/uL   URINALYSIS, MACRO/MICRO   Result Value Ref Range    COLOR Light Yellow (A) Yellow    APPEARANCE Clear Clear    SPECIFIC GRAVITY 1.010 1.003 - 1.035    PH 7.0 4.6 - 8.0    LEUKOCYTES Negative Negative WBCs/uL    NITRITE Negative Negative    PROTEIN Negative Negative mg/dL    GLUCOSE Negative Negative mg/dL    KETONES Negative Negative mg/dL    BILIRUBIN Negative Negative mg/dL    BLOOD Negative Negative mg/dL    UROBILINOGEN 0.2 0.2 - 1.0 mg/dL        Radiology:  XR KUB    XR KUB   Final Result    NO ACUTE FINDINGS            Radiologist location ID: [id number]             ECG:  NONE            Differential diagnosis  1. Chronic abdominal pain 2. Left upper quadrant abdominal pain    Course/Disposition/Plan     Course:     ED Course as of 07/10/22 2056   Sun Jul 10, 2022   2052 Patient will be started back on omeprazole and Linzess per his last prescription for patient also referred to gastroenterologist   Baseline labs have been obtain abdominal x-ray will be obtained patient with 2 year history of pain patient referred to gastroenterologist    Disposition:    Discharged    Condition at Disposition:     Stable  Follow up:   Rogers Seeds, MD  [address]  [place] [address]  [phone]    Schedule an appointment as soon as possible for a visit in 1 week  Follow-up on GI symptoms      Clinical Impression:     Clinical Impression   Chronic generalized abdominal pain (Primary)   Irritable bowel syndrome, unspecified type         Tivis Ringer, MD

## 2022-07-12 ENCOUNTER — Telehealth: Payer: Self-pay

## 2022-07-12 NOTE — Transitions of Care (Post Inpatient/ED Visit) (Signed)
   07/12/2022  Name: Jacob Bradley MRN: [id number] DOB: 07-06-1995  Today's TOC FU Call Status: Today's TOC FU Call Status:: Unsuccessul Call (1st Attempt) Unsuccessful Call (1st Attempt) Date: 07/12/22  Attempted to reach the patient regarding the most recent Inpatient/ED visit.  Follow Up Plan: Additional outreach attempts will be made to reach the patient to complete the Transitions of Care (Post Inpatient/ED visit) call.   Signature Agnes Lawrence, CMA (AAMA)  [facility] [phone]

## 2022-07-21 NOTE — Transitions of Care (Post Inpatient/ED Visit) (Signed)
   07/21/2022  Name: Jacob Bradley MRN: [id number] DOB: 12-13-1995  Today's TOC FU Call Status: Today's TOC FU Call Status:: Unsuccessful Call (2nd Attempt) Unsuccessful Call (1st Attempt) Date: 07/12/22 Unsuccessful Call (2nd Attempt) Date: 07/21/22  Attempted to reach the patient regarding the most recent Inpatient/ED visit.  Follow Up Plan: Additional outreach attempts will be made to reach the patient to complete the Transitions of Care (Post Inpatient/ED visit) call.   Signature Agnes Lawrence, CMA (AAMA)  [facility] [phone]

## 2022-07-25 NOTE — Transitions of Care (Post Inpatient/ED Visit) (Signed)
   07/25/2022  Name: Jacob Bradley MRN: [id number] DOB: 09/21/1995  Today's TOC FU Call Status: Today's TOC FU Call Status:: Successful TOC FU Call Competed Unsuccessful Call (1st Attempt) Date: 07/12/22 Unsuccessful Call (2nd Attempt) Date: 07/21/22 Unsuccessful Call (3rd Attempt) Date: 07/25/22 [facility] FU Call Complete Date: 07/25/22  Attempted to reach the patient regarding the most recent Inpatient/ED visit.  Follow Up Plan: No further outreach attempts will be made at this time. We have been unable to contact the patient.  Signature Agnes Lawrence, CMA (AAMA)  [facility] [phone]

## 2022-08-19 ENCOUNTER — Ambulatory Visit: Payer: Medicaid Other | Admitting: Family Medicine

## 2022-11-25 ENCOUNTER — Inpatient Hospital Stay: Payer: Medicaid Other | Attending: Family Medicine

## 2022-12-01 ENCOUNTER — Inpatient Hospital Stay: Payer: Medicaid Other | Admitting: Oncology

## 2023-08-24 ENCOUNTER — Ambulatory Visit: Admitting: Family Medicine

## 2023-08-31 ENCOUNTER — Ambulatory Visit: Admitting: Family Medicine

## 2023-09-28 ENCOUNTER — Ambulatory Visit: Admitting: Family Medicine

## 2023-12-26 ENCOUNTER — Other Ambulatory Visit: Payer: Self-pay

## 2023-12-26 ENCOUNTER — Emergency Department (HOSPITAL_COMMUNITY)

## 2023-12-26 ENCOUNTER — Encounter (HOSPITAL_COMMUNITY): Payer: Self-pay

## 2023-12-26 ENCOUNTER — Emergency Department (HOSPITAL_COMMUNITY)
Admission: EM | Admit: 2023-12-26 | Discharge: 2023-12-26 | Disposition: A | Discharge status: Home / Self Care | Attending: Emergency Medicine | Admitting: Emergency Medicine

## 2023-12-26 DIAGNOSIS — R000 Tachycardia, unspecified: Secondary | ICD-10-CM | POA: Insufficient documentation

## 2023-12-26 DIAGNOSIS — D72829 Elevated white blood cell count, unspecified: Secondary | ICD-10-CM | POA: Diagnosis not present

## 2023-12-26 DIAGNOSIS — E876 Hypokalemia: Secondary | ICD-10-CM | POA: Insufficient documentation

## 2023-12-26 DIAGNOSIS — R1012 Left upper quadrant pain: Secondary | ICD-10-CM | POA: Insufficient documentation

## 2023-12-26 LAB — CBC WITH DIFFERENTIAL/PLATELET
Abs Immature Granulocytes: 0.04 K/uL (ref 0.00–0.07)
Basophils Absolute: 0 K/uL (ref 0.0–0.1)
Basophils Relative: 0 %
Eosinophils Absolute: 0 K/uL (ref 0.0–0.5)
Eosinophils Relative: 0 %
HCT: 43.7 % (ref 39.0–52.0)
Hemoglobin: 15.4 g/dL (ref 13.0–17.0)
Immature Granulocytes: 0 %
Lymphocytes Relative: 17 %
Lymphs Abs: 2.1 K/uL (ref 0.7–4.0)
MCH: 32.2 pg (ref 26.0–34.0)
MCHC: 35.2 g/dL (ref 30.0–36.0)
MCV: 91.2 fL (ref 80.0–100.0)
Monocytes Absolute: 0.9 K/uL (ref 0.1–1.0)
Monocytes Relative: 7 %
Neutro Abs: 9.4 K/uL — ABNORMAL HIGH (ref 1.7–7.7)
Neutrophils Relative %: 76 %
Platelets: 259 K/uL (ref 150–400)
RBC: 4.79 MIL/uL (ref 4.22–5.81)
RDW: 12.2 % (ref 11.5–15.5)
WBC: 12.5 K/uL — ABNORMAL HIGH (ref 4.0–10.5)
nRBC: 0 % (ref 0.0–0.2)

## 2023-12-26 LAB — LIPASE, BLOOD: Lipase: 20 U/L (ref 11–51)

## 2023-12-26 LAB — COMPREHENSIVE METABOLIC PANEL WITH GFR
ALT: 27 U/L (ref 0–44)
AST: 54 U/L — ABNORMAL HIGH (ref 15–41)
Albumin: 4.8 g/dL (ref 3.5–5.0)
Alkaline Phosphatase: 59 U/L (ref 38–126)
Anion gap: 15 (ref 5–15)
BUN: 19 mg/dL (ref 6–20)
CO2: 25 mmol/L (ref 22–32)
Calcium: 9.1 mg/dL (ref 8.9–10.3)
Chloride: 101 mmol/L (ref 98–111)
Creatinine, Ser: 1.25 mg/dL — ABNORMAL HIGH (ref 0.61–1.24)
GFR, Estimated: >60 mL/min (ref >60)
Glucose, Bld: 110 mg/dL — ABNORMAL HIGH (ref 70–99)
Potassium: 3.4 mmol/L — ABNORMAL LOW (ref 3.5–5.1)
Sodium: 141 mmol/L (ref 135–145)
Total Bilirubin: 2.4 mg/dL — ABNORMAL HIGH (ref 0.0–1.2)
Total Protein: 7.4 g/dL (ref 6.5–8.1)

## 2023-12-26 MED ORDER — MORPHINE SULFATE (PF) 4 MG/ML IV SOLN
6.0000 mg | Freq: Once | INTRAVENOUS | Status: AC
  Administered 2023-12-26: 6 mg via INTRAVENOUS
  Filled 2023-12-26: qty 2

## 2023-12-26 MED ORDER — IOHEXOL 300 MG/ML  SOLN
100.0000 mL | Freq: Once | INTRAMUSCULAR | Status: AC | PRN
  Administered 2023-12-26: 100 mL via INTRAVENOUS

## 2023-12-26 MED ORDER — ONDANSETRON 4 MG PO TBDP: 4.0000 mg | ORAL_TABLET | Freq: Three times a day (TID) | ORAL | 0 refills | Status: DC | PRN

## 2023-12-26 MED ORDER — ONDANSETRON HCL 4 MG/2ML IJ SOLN
4.0000 mg | Freq: Once | INTRAMUSCULAR | Status: AC
  Administered 2023-12-26: 4 mg via INTRAVENOUS
  Filled 2023-12-26: qty 2

## 2023-12-26 MED ORDER — LACTATED RINGERS IV BOLUS
1000.0000 mL | Freq: Once | INTRAVENOUS | Status: AC
  Administered 2023-12-26: 1000 mL via INTRAVENOUS

## 2023-12-26 MED ORDER — PANTOPRAZOLE SODIUM 40 MG IV SOLR
40.0000 mg | Freq: Once | INTRAVENOUS | Status: AC
  Administered 2023-12-26: 40 mg via INTRAVENOUS
  Filled 2023-12-26: qty 10

## 2023-12-26 MED ORDER — PANTOPRAZOLE SODIUM 40 MG PO TBEC
40.0000 mg | DELAYED_RELEASE_TABLET | Freq: Every day | ORAL | 0 refills | Status: AC
Start: 2023-12-26 — End: ?

## 2023-12-26 NOTE — Discharge Instructions (Addendum)
 Follow-up with your primary care provider as well as the gastroenterology specialist.  Call their office for an appointment.  Avoid alcohol , NSAIDs such as ibuprofen , Advil , Aleve, etc.  Tylenol  is still okay.  Avoid spicy foods and start the Protonix  prescription.  Return to the ER for any new or worsening abdominal pain, vomiting, vomiting blood, or any other new/concerning symptoms.

## 2023-12-26 NOTE — ED Provider Notes (Signed)
 [place] EMERGENCY DEPARTMENT AT [facility] Provider Note   CSN: [id number] Arrival date & time: 12/26/23  [id number]     Patient presents with: Abdominal Pain   Jacob Bradley is a 28 y.o. male.   HPI 28 year old male presents with abdominal pain and vomiting.  The abdominal pain is a more chronic problem that has been going on for the last few years.  He typically has trouble swallowing/digesting and has abdominal pain after eating.  However he noticed a worsening of the symptoms 2 nights ago.  Since yesterday he has had multiple episodes of emesis.  Has been throwing up all night/this morning as well.  The most recent time he threw up blood and he describes it as mostly entirely blood with some clots.  Previous to this all of his other emesis episodes were just stomach acid.  He is having pain in his left upper quadrant.  He denies alcohol  use, states he used to use marijuana but has not in a couple months since his doctor told him that might be the cause of his abdominal pain.  No fevers, chest pain, shortness of breath, diarrhea.  No bloody or black bowel movements.  Prior to Admission medications   Medication Sig Start Date End Date Taking? Authorizing Provider  ondansetron  (ZOFRAN -ODT) 4 MG disintegrating tablet Take 1 tablet (4 mg total) by mouth every 8 (eight) hours as needed for nausea or vomiting. 12/26/23  Yes Freddi Hamilton, MD  pantoprazole  (PROTONIX ) 40 MG tablet Take 1 tablet (40 mg total) by mouth daily. 12/26/23  Yes Freddi Hamilton, MD  hyoscyamine  (LEVSIN  SL) 0.125 MG SL tablet Place 1 tablet (0.125 mg total) under the tongue 4 (four) times daily -  before meals and at bedtime. 05/03/22   Rourk, Lamar HERO, MD  linaCLOtide (LINZESS PO) Take 72 mg by mouth. Samples were given from GI    [provider]  sertraline  (ZOLOFT ) 25 MG tablet Take 1 tablet (25 mg total) by mouth daily. 05/13/22   Bacchus, Meade PEDLAR, FNP    Allergies: Patient has no known allergies.     Review of Systems  Constitutional:  Negative for fever.  Cardiovascular:  Negative for chest pain.  Gastrointestinal:  Positive for abdominal pain, nausea and vomiting. Negative for blood in stool and diarrhea.    Updated Vital Signs BP 118/68 (BP Location: Left Arm)   Pulse 80   Temp 98.6 F (37 C) (Oral)   Resp 18   Ht 5' 5 (1.651 m)   Wt 63.5 kg   SpO2 95%   BMI 23.30 kg/m   Physical Exam Vitals and nursing note reviewed.  Constitutional:      General: He is not in acute distress.    Appearance: He is well-developed. He is not ill-appearing or diaphoretic.  HENT:     Head: Normocephalic and atraumatic.  Cardiovascular:     Rate and Rhythm: Regular rhythm. Tachycardia present.     Heart sounds: Normal heart sounds.     Comments: HR~100 Pulmonary:     Effort: Pulmonary effort is normal.     Breath sounds: Normal breath sounds.  Abdominal:     Palpations: Abdomen is soft.     Tenderness: There is abdominal tenderness (generalized, but most focally in LUQ) in the left upper quadrant.  Skin:    General: Skin is warm and dry.  Neurological:     Mental Status: He is alert.     (all labs ordered are listed,  but only abnormal results are displayed) Labs Reviewed  COMPREHENSIVE METABOLIC PANEL WITH GFR - Abnormal; Notable for the following components:      Result Value   Potassium 3.4 (*)    Glucose, Bld 110 (*)    Creatinine, Ser 1.25 (*)    AST 54 (*)    Total Bilirubin 2.4 (*)    All other components within normal limits  CBC WITH DIFFERENTIAL/PLATELET - Abnormal; Notable for the following components:   WBC 12.5 (*)    Neutro Abs 9.4 (*)    All other components within normal limits  LIPASE, BLOOD  URINALYSIS, ROUTINE W REFLEX MICROSCOPIC    EKG: None  Radiology: CT ABDOMEN PELVIS W CONTRAST Result Date: 12/26/2023 EXAM: CT ABDOMEN AND PELVIS WITH CONTRAST 12/26/2023 09:50:22 AM TECHNIQUE: CT of the abdomen and pelvis was performed with the  administration of 100 mL of iohexol  (OMNIPAQUE ) 300 MG/ML solution. Multiplanar reformatted images are provided for review. Automated exposure control, iterative reconstruction, and/or weight-based adjustment of the mA/kV was utilized to reduce the radiation dose to as low as reasonably achievable. COMPARISON: 03/24/2022 CLINICAL HISTORY: left upper quadrant pain FINDINGS: LOWER CHEST: No acute abnormality. LIVER: Focal steatosis adjacent to the hepatic falciform ligament. GALLBLADDER AND BILE DUCTS: Gallbladder is unremarkable. No biliary ductal dilatation. SPLEEN: No acute abnormality. PANCREAS: No acute abnormality. ADRENAL GLANDS: No acute abnormality. KIDNEYS, URETERS AND BLADDER: No stones in the kidneys or ureters. No hydronephrosis. No perinephric or periureteral stranding. Urinary bladder is unremarkable. GI AND BOWEL: Stomach demonstrates no acute abnormality. Normal small bowel. There is no bowel obstruction. Normal terminal ileum and appendix. PERITONEUM AND RETROPERITONEUM: No ascites. No free air. VASCULATURE: Aorta is normal in caliber. LYMPH NODES: No lymphadenopathy. REPRODUCTIVE ORGANS: No acute abnormality. BONES AND SOFT TISSUES: No acute osseous abnormality. No focal soft tissue abnormality. IMPRESSION: 1. No acute findings in the abdomen or pelvis. No explanation for left-sided pain. Electronically signed by: Rockey Kilts MD 12/26/2023 10:40 AM EST RP Workstation: [id number]     Procedures   Medications Ordered in the ED  morphine  (PF) 4 MG/ML injection 6 mg (6 mg Intravenous Given 12/26/23 0815)  pantoprazole  (PROTONIX ) injection 40 mg (40 mg Intravenous Given 12/26/23 0817)  ondansetron  (ZOFRAN ) injection 4 mg (4 mg Intravenous Given 12/26/23 0817)  lactated ringers bolus 1,000 mL (0 mLs Intravenous Stopped 12/26/23 1204)  iohexol  (OMNIPAQUE ) 300 MG/ML solution 100 mL (100 mLs Intravenous Contrast Given 12/26/23 0935)                                    Medical Decision  Making Amount and/or Complexity of Data Reviewed Labs: ordered.    Details: Normal hemoglobin Radiology: ordered and independent interpretation performed.    Details: No bowel obstruction or pneumoperitoneum  Risk Prescription drug management.   Patient with what sounds like acute on chronic abdominal pain.  Has been attributed to hyperemesis from [facility], but by his report he has not had any marijuana in the last couple months.  He reports 1 episode of blood in his emesis.  He thinks it was from how hard he was vomiting.  I did discuss we could consider admitting him and getting GI involved in the hospital but he prefers to go home and has had not had any vomiting in multiple hours.  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  Will put him on Protonix  and recommend he avoid NSAIDs, alcohol ,  etc.  Otherwise, he is feeling a lot better, CT is unremarkable and labs are unremarkable compared to baseline besides some mild dehydration.  I suspect his leukocytosis is from vomiting.  Will discharge home with return precautions.     Final diagnoses:  Left upper quadrant abdominal pain    ED Discharge Orders          Ordered    ondansetron  (ZOFRAN -ODT) 4 MG disintegrating tablet  Every 8 hours PRN        12/26/23 1114    pantoprazole  (PROTONIX ) 40 MG tablet  Daily        12/26/23 1114               Freddi Hamilton, MD 12/26/23 1554

## 2023-12-26 NOTE — ED Triage Notes (Addendum)
 Pt BIB [facility] from home with complaints of abdominal pain and vomiting blood that has been going on for the past two days. Pt states that he stopped smoking weed and drinking alcohol  about 2 months ago because it makes it worse. Pain is located in mid and LUQ area on the abdomen. 4 mg of zofran  and 100 ml of ns given in route.

## 2024-01-12 ENCOUNTER — Inpatient Hospital Stay: Payer: Self-pay | Admitting: Family Medicine

## 2024-02-26 ENCOUNTER — Emergency Department (HOSPITAL_COMMUNITY)

## 2024-02-26 ENCOUNTER — Emergency Department (HOSPITAL_COMMUNITY)
Admission: EM | Admit: 2024-02-26 | Discharge: 2024-02-26 | Disposition: A | Discharge status: Home / Self Care | Attending: Emergency Medicine | Admitting: Emergency Medicine

## 2024-02-26 ENCOUNTER — Encounter (HOSPITAL_COMMUNITY): Payer: Self-pay | Admitting: *Deleted

## 2024-02-26 ENCOUNTER — Other Ambulatory Visit: Payer: Self-pay

## 2024-02-26 DIAGNOSIS — D72829 Elevated white blood cell count, unspecified: Secondary | ICD-10-CM | POA: Diagnosis not present

## 2024-02-26 DIAGNOSIS — R1084 Generalized abdominal pain: Secondary | ICD-10-CM | POA: Diagnosis present

## 2024-02-26 DIAGNOSIS — R1030 Lower abdominal pain, unspecified: Secondary | ICD-10-CM

## 2024-02-26 LAB — DIFFERENTIAL
Abs Immature Granulocytes: 0.05 K/uL (ref 0.00–0.07)
Basophils Absolute: 0 K/uL (ref 0.0–0.1)
Basophils Relative: 0 %
Eosinophils Absolute: 0 K/uL (ref 0.0–0.5)
Eosinophils Relative: 0 %
Immature Granulocytes: 0 %
Lymphocytes Relative: 18 %
Lymphs Abs: 2.7 K/uL (ref 0.7–4.0)
Monocytes Absolute: 0.7 K/uL (ref 0.1–1.0)
Monocytes Relative: 5 %
Neutro Abs: 11.5 K/uL — ABNORMAL HIGH (ref 1.7–7.7)
Neutrophils Relative %: 77 %

## 2024-02-26 LAB — URINALYSIS, ROUTINE W REFLEX MICROSCOPIC
Bilirubin Urine: NEGATIVE
Glucose, UA: NEGATIVE mg/dL
Hgb urine dipstick: NEGATIVE
Ketones, ur: NEGATIVE mg/dL
Leukocytes,Ua: NEGATIVE
Nitrite: NEGATIVE
Protein, ur: NEGATIVE mg/dL
Specific Gravity, Urine: 1.045 — ABNORMAL HIGH (ref 1.005–1.030)
pH: 8 (ref 5.0–8.0)

## 2024-02-26 LAB — COMPREHENSIVE METABOLIC PANEL WITH GFR
ALT: 15 U/L (ref 0–44)
AST: 25 U/L (ref 15–41)
Albumin: 4.7 g/dL (ref 3.5–5.0)
Alkaline Phosphatase: 70 U/L (ref 38–126)
Anion gap: 15 (ref 5–15)
BUN: 8 mg/dL (ref 6–20)
CO2: 25 mmol/L (ref 22–32)
Calcium: 9.9 mg/dL (ref 8.9–10.3)
Chloride: 101 mmol/L (ref 98–111)
Creatinine, Ser: 0.92 mg/dL (ref 0.61–1.24)
GFR, Estimated: >60 mL/min
Glucose, Bld: 116 mg/dL — ABNORMAL HIGH (ref 70–99)
Potassium: 3.5 mmol/L (ref 3.5–5.1)
Sodium: 141 mmol/L (ref 135–145)
Total Bilirubin: 0.6 mg/dL (ref 0.0–1.2)
Total Protein: 7.4 g/dL (ref 6.5–8.1)

## 2024-02-26 LAB — CBC
HCT: 43.8 % (ref 39.0–52.0)
Hemoglobin: 15.2 g/dL (ref 13.0–17.0)
MCH: 31.7 pg (ref 26.0–34.0)
MCHC: 34.7 g/dL (ref 30.0–36.0)
MCV: 91.4 fL (ref 80.0–100.0)
Platelets: 390 K/uL (ref 150–400)
RBC: 4.79 MIL/uL (ref 4.22–5.81)
RDW: 12.4 % (ref 11.5–15.5)
WBC: 15.2 K/uL — ABNORMAL HIGH (ref 4.0–10.5)
nRBC: 0 % (ref 0.0–0.2)

## 2024-02-26 LAB — LIPASE, BLOOD: Lipase: 13 U/L (ref 11–51)

## 2024-02-26 MED ORDER — SODIUM CHLORIDE 0.9 % IV BOLUS
1000.0000 mL | Freq: Once | INTRAVENOUS | Status: AC
  Administered 2024-02-26: 1000 mL via INTRAVENOUS

## 2024-02-26 MED ORDER — ONDANSETRON HCL 4 MG/2ML IJ SOLN
4.0000 mg | Freq: Once | INTRAMUSCULAR | Status: AC | PRN
  Administered 2024-02-26: 4 mg via INTRAVENOUS
  Filled 2024-02-26: qty 2

## 2024-02-26 MED ORDER — HYDROMORPHONE HCL 1 MG/ML IJ SOLN
1.0000 mg | Freq: Once | INTRAMUSCULAR | Status: AC
  Administered 2024-02-26: 1 mg via INTRAVENOUS
  Filled 2024-02-26: qty 1

## 2024-02-26 MED ORDER — PANTOPRAZOLE SODIUM 40 MG IV SOLR
40.0000 mg | Freq: Once | INTRAVENOUS | Status: AC
  Administered 2024-02-26: 40 mg via INTRAVENOUS
  Filled 2024-02-26: qty 10

## 2024-02-26 MED ORDER — LORAZEPAM 2 MG/ML IJ SOLN
1.0000 mg | Freq: Once | INTRAMUSCULAR | Status: AC
  Administered 2024-02-26: 1 mg via INTRAMUSCULAR
  Filled 2024-02-26: qty 1

## 2024-02-26 MED ORDER — ONDANSETRON 4 MG PO TBDP: ORAL_TABLET | ORAL | 0 refills | Status: AC

## 2024-02-26 MED ORDER — OXYCODONE-ACETAMINOPHEN 5-325 MG PO TABS: 1.0000 | ORAL_TABLET | Freq: Four times a day (QID) | ORAL | 0 refills | Status: AC | PRN

## 2024-02-26 MED ORDER — IOHEXOL 300 MG/ML  SOLN
100.0000 mL | Freq: Once | INTRAMUSCULAR | Status: AC | PRN
  Administered 2024-02-26: 100 mL via INTRAVENOUS

## 2024-02-26 NOTE — ED Notes (Signed)
 Pt states he needs his wife to help him utilize the bathroom. Pt informed he'll have to get a cath if he doesn't go in the next 10 mins.

## 2024-02-26 NOTE — ED Triage Notes (Signed)
 Pt c/o left upper abdominal pain that started suddenly last night at 1am  Pt very diaphoretic during triage  Pt vomited total of 4 times

## 2024-02-26 NOTE — Discharge Instructions (Signed)
 Increase your Protonix  so you are taking 1 pill twice a day.  Follow-up with your GI doctor and you have been referred to a hematology oncologist for your elevated white count

## 2024-02-27 NOTE — ED Provider Notes (Signed)
 " [place] EMERGENCY DEPARTMENT AT [facility] Provider Note   CSN: [id number] Arrival date & time: 02/26/24  9270     Patient presents with: Abdominal Pain   Jacob Bradley is a 29 y.o. male.   Patient complains of abdominal pain.  She has a history of chronic abdominal pain.  Patient also complains of vomiting  The history is provided by the patient and medical records. No language interpreter was used.  Abdominal Pain Pain location:  Generalized Pain quality: aching   Pain radiates to:  Does not radiate Associated symptoms: vomiting   Associated symptoms: no chest pain, no cough, no diarrhea, no fatigue and no hematuria        Prior to Admission medications  Medication Sig Start Date End Date Taking? Authorizing Provider  ondansetron  (ZOFRAN -ODT) 4 MG disintegrating tablet 4mg  ODT q4 hours prn nausea/vomit 02/26/24  Yes Keavon Sensing, MD  oxyCODONE -acetaminophen  (PERCOCET/ROXICET) 5-325 MG tablet Take 1 tablet by mouth every 6 (six) hours as needed for severe pain (pain score 7-10). 02/26/24  Yes Woodward Klem, MD  hyoscyamine  (LEVSIN  SL) 0.125 MG SL tablet Place 1 tablet (0.125 mg total) under the tongue 4 (four) times daily -  before meals and at bedtime. 05/03/22   Rourk, Lamar HERO, MD  linaCLOtide (LINZESS PO) Take 72 mg by mouth. Samples were given from GI    [provider]  pantoprazole  (PROTONIX ) 40 MG tablet Take 1 tablet (40 mg total) by mouth daily. 12/26/23   Freddi Hamilton, MD  sertraline  (ZOLOFT ) 25 MG tablet Take 1 tablet (25 mg total) by mouth daily. 05/13/22   Bacchus, Meade PEDLAR, FNP    Allergies: Patient has no known allergies.    Review of Systems  Constitutional:  Negative for appetite change and fatigue.  HENT:  Negative for congestion, ear discharge and sinus pressure.   Eyes:  Negative for discharge.  Respiratory:  Negative for cough.   Cardiovascular:  Negative for chest pain.  Gastrointestinal:  Positive for abdominal pain and  vomiting. Negative for diarrhea.  Genitourinary:  Negative for frequency and hematuria.  Musculoskeletal:  Negative for back pain.  Skin:  Negative for rash.  Neurological:  Negative for seizures and headaches.  Psychiatric/Behavioral:  Negative for hallucinations.     Updated Vital Signs BP 113/68   Pulse 79   Temp 98.1 F (36.7 C) (Oral)   Resp 16   Ht 5' 5 (1.651 m)   Wt 63.5 kg   SpO2 95%   BMI 23.30 kg/m   Physical Exam Vitals reviewed.  Constitutional:      Appearance: He is well-developed.  HENT:     Head: Normocephalic.     Nose: Nose normal.  Eyes:     General: No scleral icterus.    Conjunctiva/sclera: Conjunctivae normal.  Neck:     Thyroid : No thyromegaly.  Cardiovascular:     Rate and Rhythm: Normal rate and regular rhythm.     Heart sounds: No murmur heard.    No friction rub. No gallop.  Pulmonary:     Breath sounds: No stridor. No wheezing or rales.  Chest:     Chest wall: No tenderness.  Abdominal:     General: There is no distension.     Tenderness: There is abdominal tenderness. There is no rebound.  Musculoskeletal:        General: Normal range of motion.     Cervical back: Neck supple.  Lymphadenopathy:     Cervical: No  cervical adenopathy.  Skin:    Findings: No erythema or rash.  Neurological:     Mental Status: He is alert and oriented to person, place, and time.     Motor: No abnormal muscle tone.     Coordination: Coordination normal.  Psychiatric:        Behavior: Behavior normal.     (all labs ordered are listed, but only abnormal results are displayed) Labs Reviewed  COMPREHENSIVE METABOLIC PANEL WITH GFR - Abnormal; Notable for the following components:      Result Value   Glucose, Bld 116 (*)    All other components within normal limits  CBC - Abnormal; Notable for the following components:   WBC 15.2 (*)    All other components within normal limits  URINALYSIS, ROUTINE W REFLEX MICROSCOPIC - Abnormal; Notable for the  following components:   Specific Gravity, Urine 1.045 (*)    All other components within normal limits  DIFFERENTIAL - Abnormal; Notable for the following components:   Neutro Abs 11.5 (*)    All other components within normal limits  LIPASE, BLOOD    EKG: None  Radiology: CT ABDOMEN PELVIS W CONTRAST Result Date: 02/26/2024 CLINICAL DATA:  Acute left-sided abdominal beginning last night. Diaphoresis. EXAM: CT ABDOMEN AND PELVIS WITH CONTRAST TECHNIQUE: Multidetector CT imaging of the abdomen and pelvis was performed using the standard protocol following bolus administration of intravenous contrast. RADIATION DOSE REDUCTION: This exam was performed according to the departmental dose-optimization program which includes automated exposure control, adjustment of the mA and/or kV according to patient size and/or use of iterative reconstruction technique. CONTRAST:  OMNIPAQUE  IOHEXOL  300 MG/ML  SOLN COMPARISON:  12/26/2023 FINDINGS: Lower Chest: No acute findings. Hepatobiliary: No suspicious hepatic masses identified. Gallbladder is unremarkable. No evidence of biliary ductal dilatation. Pancreas:  No mass or inflammatory changes. Spleen: Within normal limits in size and appearance. Adrenals/Urinary Tract: No suspicious masses identified. No evidence of ureteral calculi or hydronephrosis. Unremarkable unopacified urinary bladder. Stomach/Bowel: No evidence of obstruction, inflammatory process or abnormal fluid collections. Normal appendix visualized. Vascular/Lymphatic: No pathologically enlarged lymph nodes. No acute vascular findings. Reproductive:  No mass or other significant abnormality. Other:  None. Musculoskeletal:  No suspicious bone lesions identified. IMPRESSION: Negative. No acute findings or other significant abnormality. Electronically Signed   By: Norleen DELENA Kil M.D.   On: 02/26/2024 09:44     Procedures   Medications Ordered in the ED  ondansetron  (ZOFRAN ) injection 4 mg (4 mg  Intravenous Given 02/26/24 0801)  sodium chloride  0.9 % bolus 1,000 mL (0 mLs Intravenous Stopped 02/26/24 1047)  HYDROmorphone  (DILAUDID ) injection 1 mg (1 mg Intravenous Given 02/26/24 0802)  LORazepam  (ATIVAN ) injection 1 mg (1 mg Intramuscular Given 02/26/24 0801)  pantoprazole  (PROTONIX ) injection 40 mg (40 mg Intravenous Given 02/26/24 0800)  iohexol  (OMNIPAQUE ) 300 MG/ML solution 100 mL (100 mLs Intravenous Contrast Given 02/26/24 0851)  HYDROmorphone  (DILAUDID ) injection 1 mg (1 mg Intravenous Given 02/26/24 0911)  LORazepam  (ATIVAN ) injection 1 mg (1 mg Intramuscular Given 02/26/24 0911)                                    Medical Decision Making Amount and/or Complexity of Data Reviewed Labs: ordered. Radiology: ordered.  Risk Prescription drug management.  Patient with persistent leukocytosis.  He is referred to heme-onc.  Patient with chronic abdominal pain     Final diagnoses:  Lower abdominal  pain  Leukocytosis, unspecified type    ED Discharge Orders          Ordered    Ambulatory referral to Hematology / Oncology       Comments: Your emergency department provider has referred you to see a hematology/oncology specialist. These are physicians who specialize in blood disorders and cancers, or findings concerning for cancer. You will receive a phone call from the [facility] Office to set up your appointment within 2 business days: [employer] operate Mon - Fri, 8:00 a.m. to 5:00 p.m.; closed for federally recognized holidays. Please be sure your phone is not set to block numbers during this time.   02/26/24 1312    ondansetron  (ZOFRAN -ODT) 4 MG disintegrating tablet        02/26/24 1314    oxyCODONE -acetaminophen  (PERCOCET/ROXICET) 5-325 MG tablet  Every 6 hours PRN        02/26/24 1314               Tristina Sahagian, MD 02/27/24 1703  "

## 2024-03-07 ENCOUNTER — Inpatient Hospital Stay: Attending: Oncology | Admitting: Oncology

## 2024-03-07 ENCOUNTER — Inpatient Hospital Stay

## 2024-03-07 VITALS — BP 137/92 | HR 87 | Temp 98.4°F | Resp 16 | Wt 157.2 lb

## 2024-03-07 DIAGNOSIS — G8929 Other chronic pain: Secondary | ICD-10-CM | POA: Diagnosis not present

## 2024-03-07 DIAGNOSIS — R5382 Chronic fatigue, unspecified: Secondary | ICD-10-CM | POA: Insufficient documentation

## 2024-03-07 DIAGNOSIS — L299 Pruritus, unspecified: Secondary | ICD-10-CM | POA: Insufficient documentation

## 2024-03-07 DIAGNOSIS — R42 Dizziness and giddiness: Secondary | ICD-10-CM | POA: Diagnosis not present

## 2024-03-07 DIAGNOSIS — D72829 Elevated white blood cell count, unspecified: Secondary | ICD-10-CM | POA: Diagnosis not present

## 2024-03-07 DIAGNOSIS — K625 Hemorrhage of anus and rectum: Secondary | ICD-10-CM | POA: Insufficient documentation

## 2024-03-07 DIAGNOSIS — R079 Chest pain, unspecified: Secondary | ICD-10-CM | POA: Insufficient documentation

## 2024-03-07 DIAGNOSIS — R61 Generalized hyperhidrosis: Secondary | ICD-10-CM | POA: Insufficient documentation

## 2024-03-07 DIAGNOSIS — Z8379 Family history of other diseases of the digestive system: Secondary | ICD-10-CM | POA: Diagnosis not present

## 2024-03-07 DIAGNOSIS — K089 Disorder of teeth and supporting structures, unspecified: Secondary | ICD-10-CM | POA: Insufficient documentation

## 2024-03-07 DIAGNOSIS — R519 Headache, unspecified: Secondary | ICD-10-CM | POA: Diagnosis not present

## 2024-03-07 DIAGNOSIS — R1310 Dysphagia, unspecified: Secondary | ICD-10-CM | POA: Insufficient documentation

## 2024-03-07 DIAGNOSIS — F17210 Nicotine dependence, cigarettes, uncomplicated: Secondary | ICD-10-CM | POA: Diagnosis not present

## 2024-03-07 DIAGNOSIS — R002 Palpitations: Secondary | ICD-10-CM | POA: Insufficient documentation

## 2024-03-07 DIAGNOSIS — R21 Rash and other nonspecific skin eruption: Secondary | ICD-10-CM | POA: Diagnosis not present

## 2024-03-07 DIAGNOSIS — R1012 Left upper quadrant pain: Secondary | ICD-10-CM | POA: Diagnosis not present

## 2024-03-07 DIAGNOSIS — R634 Abnormal weight loss: Secondary | ICD-10-CM | POA: Diagnosis not present

## 2024-03-07 LAB — CBC WITH DIFFERENTIAL/PLATELET
Abs Immature Granulocytes: 0.02 K/uL (ref 0.00–0.07)
Basophils Absolute: 0 K/uL (ref 0.0–0.1)
Basophils Relative: 1 %
Eosinophils Absolute: 0.2 K/uL (ref 0.0–0.5)
Eosinophils Relative: 3 %
HCT: 46.7 % (ref 39.0–52.0)
Hemoglobin: 16 g/dL (ref 13.0–17.0)
Immature Granulocytes: 0 %
Lymphocytes Relative: 35 %
Lymphs Abs: 2.8 K/uL (ref 0.7–4.0)
MCH: 31.9 pg (ref 26.0–34.0)
MCHC: 34.3 g/dL (ref 30.0–36.0)
MCV: 93.2 fL (ref 80.0–100.0)
Monocytes Absolute: 0.8 K/uL (ref 0.1–1.0)
Monocytes Relative: 9 %
Neutro Abs: 4.2 K/uL (ref 1.7–7.7)
Neutrophils Relative %: 52 %
Platelets: 283 K/uL (ref 150–400)
RBC: 5.01 MIL/uL (ref 4.22–5.81)
RDW: 12.8 % (ref 11.5–15.5)
WBC: 8 K/uL (ref 4.0–10.5)
nRBC: 0 % (ref 0.0–0.2)

## 2024-03-07 LAB — LACTATE DEHYDROGENASE: LDH: 223 U/L (ref 105–235)

## 2024-03-07 NOTE — Assessment & Plan Note (Addendum)
-   We reviewed labs from 02/26/24: White count 15.2.  ANC 9.4.  Differential grossly normal. -She has had persistently elevated white counts ranging from 11.1-17.1 since 2018. -Patient was seen by Dr. Rogers about 2 years ago with negative flow cytometry, BCR able by FISH and JAK2 mutation. -Most recent CT scan from 02/26/2024 showed a normal spleen.  No lymphadenopathy. -Leukocytosis thought to be secondary to smoking cigarettes or possible infection given recent dental health.  He continues to smoke 1 pack of cigarettes every other day.  Previous to that, he was smoking 2 packs/day.  He also uses a vape he no longer smokes marijuana but does eat edibles.  -Recommend repeating flow cytometry, JAK2 and BCR able testing given persistent and rising white blood cell count.  Patient would also like a bone marrow biopsy. -Return to clinic after bone marrow biopsy to review results.

## 2024-03-07 NOTE — Assessment & Plan Note (Addendum)
 Patient has had persistent intermittent left upper quadrant pain for about 2 years and it is becoming more consistent.  Reports he has been seen on multiple occasions in the emergency room for this concern. Patient has had previous colonoscopies/EGD although it has been a few years. He has follow-up with GI on 03/26/2024 here locally. Most recent CT scan from 02/26/2024 was essentially unremarkable. Reports he has history of IBS but feels like something else is going on. He is also having some dysphagia and feels like things get stuck in his esophagus. No nausea or vomiting.

## 2024-03-07 NOTE — Assessment & Plan Note (Addendum)
 Reports poor dental hygiene and brittle teeth. He recently had to have 5 teeth pulled and will have additional teeth pulled in the future.  Reports his teeth were cracking.  He saw his dentist yesterday who had to shave down a bone in his mouth. He does not think he has any infected tooth and no recent antibiotics. He was previously on narcotics but now just takes Tylenol .

## 2024-03-07 NOTE — Progress Notes (Signed)
 "  [facility] OFFICE PROGRESS NOTE  Bacchus, Meade PEDLAR, FNP  ASSESSMENT & PLAN:    Assessment & Plan Leukocytosis, unspecified type - We reviewed labs from 02/26/24: White count 15.2.  ANC 9.4.  Differential grossly normal. -She has had persistently elevated white counts ranging from 11.1-17.1 since 2018. -Patient was seen by Dr. Rogers about 2 years ago with negative flow cytometry, BCR able by FISH and JAK2 mutation. -Most recent CT scan from 02/26/2024 showed a normal spleen.  No lymphadenopathy. -Leukocytosis thought to be secondary to smoking cigarettes or possible infection given recent dental health.  He continues to smoke 1 pack of cigarettes every other day.  Previous to that, he was smoking 2 packs/day.  He also uses a vape he no longer smokes marijuana but does eat edibles.  -Recommend repeating flow cytometry, JAK2 and BCR able testing given persistent and rising white blood cell count.  Patient would also like a bone marrow biopsy. -Return to clinic after bone marrow biopsy to review results.   Abdominal pain, chronic, left upper quadrant Patient has had persistent intermittent left upper quadrant pain for about 2 years and it is becoming more consistent.  Reports he has been seen on multiple occasions in the emergency room for this concern. Patient has had previous colonoscopies/EGD although it has been a few years. He has follow-up with GI on 03/26/2024 here locally. Most recent CT scan from 02/26/2024 was essentially unremarkable. Reports he has history of IBS but feels like something else is going on. He is also having some dysphagia and feels like things get stuck in his esophagus. No nausea or vomiting. Dental disease Reports poor dental hygiene and brittle teeth. He recently had to have 5 teeth pulled and will have additional teeth pulled in the future.  Reports his teeth were cracking.  He saw his dentist yesterday who had to shave down a bone in his  mouth. He does not think he has any infected tooth and no recent antibiotics. He was previously on narcotics but now just takes Tylenol .  Orders Placed This Encounter  Procedures   CT BONE MARROW BIOPSY & ASPIRATION    Standing Status:   Future    Expected Date:   03/14/2024    Expiration Date:   03/07/2025    Reason for Exam (SYMPTOM  OR DIAGNOSIS REQUIRED):   leukocytosis    Preferred imaging location?:   [facility]    Radiology Contrast Protocol - do NOT remove file path:   [email or web address]   BCR-ABL1 FISH    Standing Status:   Future    Number of Occurrences:   1    Expiration Date:   03/07/2025   CBC with Differential    Standing Status:   Future    Number of Occurrences:   1    Expiration Date:   03/07/2025   JAK2 V617 reflex CALR/MPL/E12-15    Standing Status:   Future    Number of Occurrences:   1    Expiration Date:   03/07/2025   Lactate dehydrogenase    Standing Status:   Future    Number of Occurrences:   1    Expiration Date:   03/07/2025   Comp panel: Leukemia/Lymphoma    INTERVAL HISTORY: Patient returns for follow-up for leukocytosis.  Since his last visit almost a year ago, patient was seen in [place]  [facility] for left upper abdominal pain.  He was instructed to continue omeprazole  and  follow-up with GI.  He was evaluated again on 06/27/2023 and 12/26/2023 for abdominal pain with nausea and vomiting.  Patient had imaging which did not reveal any acute pathology in the abdomen or pelvis.  He was given IV fluids, Zofran , fentanyl , Toradol with improvement of his symptoms.  He was also highly anxious and given Ativan .  Most recently, he was evaluated for abdominal pain on 02/26/2023 and referred back to heme-onc given persistent leukocytosis.  Patient presents today with his wife.  States he is chronic left upper quadrant abdominal pain x 2 years.  States before it was just occasional and now it  happens every single day and intensity has worsened.  Reports it feels better after he eats something but it is tough to eat first thing in the morning due to pain.  Reports history of IBS although he does not think that is contributing to his pain.  He has had multiple CT scans without evidence of abnormality.  Reports appetite is very low and he has lost a lot of weight over the past 2 years.  He is unable to hold down a job due to having to call out frequently.  He does not qualify for disability.  Reports his mom has similar problems and had her gallbladder removed recently.  Bowel movements rotate from constipation and diarrhea and he has nausea and occasional vomiting.  Reports he does have bright red blood per rectum on occasion but he was told it was due to hemorrhoids.  Most recent colonoscopy and upper endoscopy was on 03/25/2020.  EGD showed scattered mild inflammation characterized by erythema in the gastric body.  He was negative for H. pylori.  Colonoscopy showed multiple hemorrhoids, diverticuli in sigmoid, descending and ascending colon.  Nonbleeding internal hemorrhoid with otherwise unremarkable mucosa.  He denies any recent infections including being on antibiotics.  Reports extensive dental work here recently and met with his dentist yesterday.  He is currently not on antibiotics but did have several teeth pulled in the past couple of weeks.  Reports the left side of his jaw is very sore.  He is currently taking Tylenol  which is not really helping his pain.  Previous to that, he was on narcotics.  Patient reports chronic fatigue, weight loss and pain every day.  We reviewed CBC, CMP.   SUMMARY OF HEMATOLOGIC HISTORY: Oncology History   No problem history exists.   1.  Leukocytosis: - Patient seen at the request of Sonny Kerns, PA-C. - CBC (02/10/2022): WBC 14.2, ANC 9K, ALC 4.2K - Mildly elevated white count since February 2018. - CTAP (03/24/2022): Normal spleen.  No acute  findings. - Weight loss 94 pounds in the last 2 years.  Decreased appetite and feels up easily.  Reports left upper quadrant pain for the last 2 to 3 years, worsening lately.  Pain starts when he has a bowel movement.  It gets better when he takes a hot shower. - Also reports drenching night sweats, every night for more than a year.  No evening fevers.  No infections.  No oral steroids. - He uses cortisone cream approximately once per week on the right leg for rash and itching.   2.  Social/family history: - He lives at home with his wife.  He has not been able to work for the last 2 to 3 years due to abdominal pain.  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 - He is current active smoker, smokes 1  pack/day for the last 1 year.  Prior to that he smoked 2 packs/day for 14 years. - Family history significant for Crohn's disease and multiple members.  No history of malignancy.  CBC    Component Value Date/Time   WBC 15.2 (H) 02/26/2024 0740   RBC 4.79 02/26/2024 0740   HGB 15.2 02/26/2024 0740   HGB 15.3 02/10/2022 1421   HCT 43.8 02/26/2024 0740   HCT 43.4 02/10/2022 1421   PLT 390 02/26/2024 0740   PLT 277 02/10/2022 1421   MCV 91.4 02/26/2024 0740   MCV 91 02/10/2022 1421   MCH 31.7 02/26/2024 0740   MCHC 34.7 02/26/2024 0740   RDW 12.4 02/26/2024 0740   RDW 12.2 02/10/2022 1421   LYMPHSABS 2.7 02/26/2024 0754   LYMPHSABS 4.2 (H) 02/10/2022 1421   MONOABS 0.7 02/26/2024 0754   EOSABS 0.0 02/26/2024 0754   EOSABS 0.1 02/10/2022 1421   BASOSABS 0.0 02/26/2024 0754   BASOSABS 0.0 02/10/2022 1421       Latest Ref Rng & Units 02/26/2024    7:40 AM 12/26/2023    7:50 AM 02/10/2022    2:21 PM  CMP  Glucose 70 - 99 mg/dL 883  889    BUN 6 - 20 mg/dL 8  19    Creatinine 9.38 - 1.24 mg/dL 9.07  8.74    Sodium 864 - 145 mmol/L 141  141    Potassium 3.5 - 5.1 mmol/L 3.5  3.4    Chloride 98 - 111 mmol/L 101  101    CO2 22 - 32 mmol/L 25  25    Calcium 8.9 -  10.3 mg/dL 9.9  9.1    Total Protein 6.5 - 8.1 g/dL 7.4  7.4  7.2   Total Bilirubin 0.0 - 1.2 mg/dL 0.6  2.4  0.4   Alkaline Phos 38 - 126 U/L 70  59  65   AST 15 - 41 U/L 25  54  15   ALT 0 - 44 U/L 15  27  15       No results found for: FERRITIN, VITAMINB12  Vitals:   03/07/24 1158  BP: (!) 137/92  Pulse: 87  Resp: 16  Temp: 98.4 F (36.9 C)  SpO2: 96%    Review of System:  Review of Systems  Constitutional:  Positive for malaise/fatigue and weight loss. Negative for fever.  HENT:         Dental pain  Cardiovascular:  Positive for chest pain and palpitations.  Gastrointestinal:  Positive for abdominal pain, blood in stool, constipation, diarrhea, heartburn, nausea and vomiting.  Neurological:  Positive for dizziness and headaches.    Physical Exam: Physical Exam Constitutional:      Appearance: Normal appearance.  HENT:     Head: Normocephalic and atraumatic.  Eyes:     Pupils: Pupils are equal, round, and reactive to light.  Cardiovascular:     Rate and Rhythm: Normal rate and regular rhythm.     Heart sounds: Normal heart sounds. No murmur heard. Pulmonary:     Effort: Pulmonary effort is normal.     Breath sounds: Normal breath sounds. No wheezing.  Abdominal:     General: Bowel sounds are normal. There is no distension.     Palpations: Abdomen is soft.     Tenderness: There is no abdominal tenderness.  Musculoskeletal:        General: Normal range of motion.     Cervical back: Normal range of motion.  Skin:  General: Skin is warm and dry.     Findings: No rash.  Neurological:     Mental Status: He is alert and oriented to person, place, and time.     Gait: Gait is intact.  Psychiatric:        Mood and Affect: Mood and affect normal.        Cognition and Memory: Memory normal.        Judgment: Judgment normal.      I spent 25 minutes dedicated to the care of this patient (face-to-face and non-face-to-face) on the date of the encounter to  include what is described in the assessment and plan.,  Delon Hope, NP 03/07/2024 1:01 PM "

## 2024-03-11 LAB — COMP PANEL: LEUKEMIA/LYMPHOMA

## 2024-03-11 LAB — BCR-ABL1 FISH
Cells Analyzed: 200
Cells Counted: 200

## 2024-03-15 LAB — JAK2 V617F RFX CALR/MPL/E12-15

## 2024-03-15 LAB — CALR +MPL + E12-E15  (REFLEX)

## 2024-03-21 ENCOUNTER — Other Ambulatory Visit: Payer: Self-pay

## 2024-03-21 DIAGNOSIS — Z01812 Encounter for preprocedural laboratory examination: Secondary | ICD-10-CM

## 2024-03-22 ENCOUNTER — Ambulatory Visit (HOSPITAL_COMMUNITY)
Admission: RE | Admit: 2024-03-22 | Discharge: 2024-03-22 | Disposition: A | Source: Ambulatory Visit | Attending: Oncology

## 2024-03-22 ENCOUNTER — Other Ambulatory Visit: Payer: Self-pay

## 2024-03-22 ENCOUNTER — Ambulatory Visit (HOSPITAL_COMMUNITY)
Admission: RE | Admit: 2024-03-22 | Discharge: 2024-03-22 | Disposition: A | Source: Ambulatory Visit | Attending: Oncology | Admitting: Oncology

## 2024-03-22 ENCOUNTER — Encounter (HOSPITAL_COMMUNITY): Payer: Self-pay

## 2024-03-22 DIAGNOSIS — Z79899 Other long term (current) drug therapy: Secondary | ICD-10-CM | POA: Insufficient documentation

## 2024-03-22 DIAGNOSIS — Z01812 Encounter for preprocedural laboratory examination: Secondary | ICD-10-CM

## 2024-03-22 DIAGNOSIS — Z555 Less than a high school diploma: Secondary | ICD-10-CM | POA: Diagnosis not present

## 2024-03-22 DIAGNOSIS — D72829 Elevated white blood cell count, unspecified: Secondary | ICD-10-CM | POA: Diagnosis present

## 2024-03-22 DIAGNOSIS — F17210 Nicotine dependence, cigarettes, uncomplicated: Secondary | ICD-10-CM | POA: Insufficient documentation

## 2024-03-22 LAB — CBC WITH DIFFERENTIAL/PLATELET
Abs Immature Granulocytes: 0.02 10*3/uL (ref 0.00–0.07)
Basophils Absolute: 0 10*3/uL (ref 0.0–0.1)
Basophils Relative: 0 %
Eosinophils Absolute: 0.2 10*3/uL (ref 0.0–0.5)
Eosinophils Relative: 3 %
HCT: 43.4 % (ref 39.0–52.0)
Hemoglobin: 15 g/dL (ref 13.0–17.0)
Immature Granulocytes: 0 %
Lymphocytes Relative: 28 %
Lymphs Abs: 2.2 10*3/uL (ref 0.7–4.0)
MCH: 31.9 pg (ref 26.0–34.0)
MCHC: 34.6 g/dL (ref 30.0–36.0)
MCV: 92.3 fL (ref 80.0–100.0)
Monocytes Absolute: 0.6 10*3/uL (ref 0.1–1.0)
Monocytes Relative: 8 %
Neutro Abs: 5 10*3/uL (ref 1.7–7.7)
Neutrophils Relative %: 61 %
Platelets: 250 10*3/uL (ref 150–400)
RBC: 4.7 MIL/uL (ref 4.22–5.81)
RDW: 12.3 % (ref 11.5–15.5)
WBC: 8.1 10*3/uL (ref 4.0–10.5)
nRBC: 0 % (ref 0.0–0.2)

## 2024-03-22 MED ORDER — FENTANYL CITRATE (PF) 100 MCG/2ML IJ SOLN
25.0000 ug | Freq: Once | INTRAMUSCULAR | Status: AC
  Administered 2024-03-22: 25 ug via INTRAVENOUS

## 2024-03-22 MED ORDER — SODIUM CHLORIDE 0.9 % IV SOLN: INTRAVENOUS | Status: DC

## 2024-03-22 MED ORDER — MIDAZOLAM HCL (PF) 2 MG/2ML IJ SOLN
INTRAMUSCULAR | Status: AC | PRN
  Administered 2024-03-22: 2 mg via INTRAVENOUS

## 2024-03-22 MED ORDER — FENTANYL CITRATE (PF) 50 MCG/ML IJ SOSY
PREFILLED_SYRINGE | INTRAMUSCULAR | Status: AC
  Filled 2024-03-22: qty 1

## 2024-03-22 MED ORDER — FENTANYL CITRATE (PF) 100 MCG/2ML IJ SOLN
INTRAMUSCULAR | Status: AC | PRN
  Administered 2024-03-22: 50 ug via INTRAVENOUS

## 2024-03-22 MED ORDER — MIDAZOLAM HCL 2 MG/2ML IJ SOLN
INTRAMUSCULAR | Status: AC
  Filled 2024-03-22: qty 2

## 2024-03-22 MED ORDER — FENTANYL CITRATE (PF) 100 MCG/2ML IJ SOLN
INTRAMUSCULAR | Status: AC
  Filled 2024-03-22: qty 2

## 2024-03-22 MED ORDER — MIDAZOLAM HCL (PF) 2 MG/2ML IJ SOLN
INTRAMUSCULAR | Status: AC | PRN
  Administered 2024-03-22: 1 mg via INTRAVENOUS

## 2024-03-22 NOTE — Progress Notes (Signed)
 1120 Paged Franky Rakers, PA to report back back level post bone marrow, Dr. Luverne in a procedure. 1125 Caller IR desk  told Franky Rakers, PA was in a procedure will have him give me a call back or he may come see Mr. Riffel.

## 2024-03-22 NOTE — Sedation Documentation (Signed)
 RN Dean Goldner pulled 4 mg Versed  and 100 mcg Fentanyl  in CT pysix. Pt. Received 4 mg Versed  and 100 mcg Fentanyl  throughout the procedure.

## 2024-03-22 NOTE — Discharge Instructions (Addendum)
Discharge Instructions:   Please call Interventional Radiology clinic [phone] with any questions or concerns.  You may remove your dressing and shower tomorrow.    Bone Marrow Aspiration and Bone Marrow Biopsy, Adult, Care After This sheet gives you information about how to care for yourself after your procedure. Your health care provider may also give you more specific instructions. If you have problems or questions, contact your health care provider. What can I expect after the procedure? After the procedure, it is common to have: Mild pain and tenderness. Swelling. Bruising. Follow these instructions at home: Puncture site care  Follow instructions from your health care provider about how to take care of the puncture site. Make sure you: Wash your hands with soap and water before and after you change your bandage (dressing). If soap and water are not available, use hand sanitizer. Change your dressing as told by your health care provider. Check your puncture site every day for signs of infection. Check for: More redness, swelling, or pain. Fluid or blood. Warmth. Pus or a bad smell. Activity Return to your normal activities as told by your health care provider. Ask your health care provider what activities are safe for you. Do not lift anything that is heavier than 10 lb (4.5 kg), or the limit that you are told, until your health care provider says that it is safe. Do not drive for 24 hours if you were given a sedative during your procedure. General instructions  Take over-the-counter and prescription medicines only as told by your health care provider. Do not take baths, swim, or use a hot tub until your health care provider approves. Ask your health care provider if you may take showers. You may only be allowed to take sponge baths. If directed, put ice on the affected area. To do this: Put ice in a plastic bag. Place a towel between your skin and the bag. Leave the ice  on for 20 minutes, 2-3 times a day. Keep all follow-up visits as told by your health care provider. This is important. Contact a health care provider if: Your pain is not controlled with medicine. You have a fever. You have more redness, swelling, or pain around the puncture site. You have fluid or blood coming from the puncture site. Your puncture site feels warm to the touch. You have pus or a bad smell coming from the puncture site. Summary After the procedure, it is common to have mild pain, tenderness, swelling, and bruising. Follow instructions from your health care provider about how to take care of the puncture site and what activities are safe for you. Take over-the-counter and prescription medicines only as told by your health care provider. Contact a health care provider if you have any signs of infection, such as fluid or blood coming from the puncture site. This information is not intended to replace advice given to you by your health care provider. Make sure you discuss any questions you have with your health care provider. Document Revised: 06/26/2018 Document Reviewed: 06/26/2018 [employer] Patient Education  2023 [employer]   Moderate Conscious Sedation, Adult, Care After This sheet gives you information about how to care for yourself after your procedure. Your health care provider may also give you more specific instructions. If you have problems or questions, contact your health care provider. What can I expect after the procedure? After the procedure, it is common to have: Sleepiness for several hours. Impaired judgment for several hours. Difficulty with balance. Vomiting if   you eat too soon. Follow these instructions at home: For the time period you were told by your health care provider: Rest. Do not participate in activities where you could fall or become injured. Do not drive or use machinery. Do not drink alcohol. Do not take sleeping pills or medicines that  cause drowsiness. Do not make important decisions or sign legal documents. Do not take care of children on your own. Eating and drinking  Follow the diet recommended by your health care provider. Drink enough fluid to keep your urine pale yellow. If you vomit: Drink water, juice, or soup when you can drink without vomiting. Make sure you have little or no nausea before eating solid foods. General instructions Take over-the-counter and prescription medicines only as told by your health care provider. Have a responsible adult stay with you for the time you are told. It is important to have someone help care for you until you are awake and alert. Do not smoke. Keep all follow-up visits as told by your health care provider. This is important. Contact a health care provider if: You are still sleepy or having trouble with balance after 24 hours. You feel light-headed. You keep feeling nauseous or you keep vomiting. You develop a rash. You have a fever. You have redness or swelling around the IV site. Get help right away if: You have trouble breathing. You have new-onset confusion at home. Summary After the procedure, it is common to feel sleepy, have impaired judgment, or feel nauseous if you eat too soon. Rest after you get home. Know the things you should not do after the procedure. Follow the diet recommended by your health care provider and drink enough fluid to keep your urine pale yellow. Get help right away if you have trouble breathing or new-onset confusion at home. This information is not intended to replace advice given to you by your health care provider. Make sure you discuss any questions you have with your health care provider. Document Revised: 06/07/2019 Document Reviewed: 01/03/2019 [employer] Patient Education  2023 [employer]  

## 2024-03-22 NOTE — Procedures (Signed)
 Interventional Radiology Procedure Note  Procedure: CT guided bone marrow aspiration and biopsy  Complications: None  EBL: < 10 mL  Findings: Aspirate and core biopsy performed of bone marrow in right iliac bone.  Plan: Bedrest supine x 1 hrs  Ericha Whittingham T. Fredia Sorrow, M.D Pager:  [phone]

## 2024-03-24 ENCOUNTER — Encounter (INDEPENDENT_AMBULATORY_CARE_PROVIDER_SITE_OTHER): Payer: Self-pay

## 2024-03-26 ENCOUNTER — Ambulatory Visit: Admitting: Gastroenterology

## 2024-03-27 LAB — SURGICAL PATHOLOGY

## 2024-03-28 ENCOUNTER — Encounter (HOSPITAL_COMMUNITY): Payer: Self-pay

## 2024-04-03 ENCOUNTER — Inpatient Hospital Stay: Admitting: Oncology

## 2024-04-17 ENCOUNTER — Inpatient Hospital Stay: Admitting: Oncology

## 2024-05-21 ENCOUNTER — Ambulatory Visit: Admitting: Gastroenterology
